# Patient Record
Sex: Male | Born: 1940 | Race: White | Hispanic: No | Marital: Married | Smoking: Former smoker
Health system: Southern US, Community
[De-identification: ages and names within clinical notes are randomized; demographics above are authoritative.]

## PROBLEM LIST (undated history)

## (undated) DIAGNOSIS — I442 Atrioventricular block, complete: Secondary | ICD-10-CM

## (undated) DIAGNOSIS — I447 Left bundle-branch block, unspecified: Secondary | ICD-10-CM

## (undated) DIAGNOSIS — R9431 Abnormal electrocardiogram [ECG] [EKG]: Secondary | ICD-10-CM

## (undated) DIAGNOSIS — I313 Pericardial effusion (noninflammatory): Secondary | ICD-10-CM

## (undated) DIAGNOSIS — K37 Unspecified appendicitis: Secondary | ICD-10-CM

## (undated) DIAGNOSIS — I420 Dilated cardiomyopathy: Secondary | ICD-10-CM

## (undated) DIAGNOSIS — I10 Essential (primary) hypertension: Secondary | ICD-10-CM

## (undated) DIAGNOSIS — R569 Unspecified convulsions: Secondary | ICD-10-CM

## (undated) DIAGNOSIS — I509 Heart failure, unspecified: Secondary | ICD-10-CM

## (undated) DIAGNOSIS — I3139 Other pericardial effusion (noninflammatory): Secondary | ICD-10-CM

## (undated) DIAGNOSIS — I480 Paroxysmal atrial fibrillation: Secondary | ICD-10-CM

## (undated) DIAGNOSIS — Z9889 Other specified postprocedural states: Secondary | ICD-10-CM

## (undated) DIAGNOSIS — J84112 Idiopathic pulmonary fibrosis: Secondary | ICD-10-CM

## (undated) HISTORY — PX: APPENDECTOMY: SHX54

## (undated) HISTORY — DX: Unspecified appendicitis: K37

## (undated) HISTORY — DX: Heart failure, unspecified: I50.9

## (undated) HISTORY — DX: Essential (primary) hypertension: I10

## (undated) HISTORY — DX: Abnormal electrocardiogram (ECG) (EKG): R94.31

---

## 2005-03-15 ENCOUNTER — Ambulatory Visit: Payer: Self-pay | Admitting: Gastroenterology

## 2005-04-08 ENCOUNTER — Ambulatory Visit: Payer: Self-pay | Admitting: Gastroenterology

## 2011-07-09 ENCOUNTER — Encounter: Payer: Self-pay | Admitting: Cardiovascular Disease

## 2011-07-16 ENCOUNTER — Encounter: Payer: Self-pay | Admitting: Cardiovascular Disease

## 2011-08-17 ENCOUNTER — Institutional Professional Consult (permissible substitution): Payer: Self-pay | Admitting: Cardiovascular Disease

## 2011-08-17 ENCOUNTER — Ambulatory Visit (INDEPENDENT_AMBULATORY_CARE_PROVIDER_SITE_OTHER): Payer: Medicare Other | Admitting: Cardiovascular Disease

## 2011-08-17 ENCOUNTER — Encounter: Payer: Self-pay | Admitting: Cardiovascular Disease

## 2011-08-17 DIAGNOSIS — I509 Heart failure, unspecified: Secondary | ICD-10-CM | POA: Diagnosis not present

## 2011-08-17 DIAGNOSIS — R9431 Abnormal electrocardiogram [ECG] [EKG]: Secondary | ICD-10-CM

## 2011-08-17 DIAGNOSIS — I10 Essential (primary) hypertension: Secondary | ICD-10-CM | POA: Diagnosis not present

## 2011-08-17 DIAGNOSIS — R0602 Shortness of breath: Secondary | ICD-10-CM

## 2011-08-17 DIAGNOSIS — I5022 Chronic systolic (congestive) heart failure: Secondary | ICD-10-CM | POA: Insufficient documentation

## 2011-08-17 LAB — BRAIN NATRIURETIC PEPTIDE: Pro B Natriuretic peptide (BNP): 20 pg/mL (ref 0.0–100.0)

## 2011-08-17 LAB — BASIC METABOLIC PANEL
Calcium: 9.1 mg/dL (ref 8.4–10.5)
Chloride: 102 mEq/L (ref 96–112)
Creatinine, Ser: 0.8 mg/dL (ref 0.4–1.5)
Sodium: 140 mEq/L (ref 135–145)

## 2011-08-17 MED ORDER — LISINOPRIL 20 MG PO TABS: 20.0000 mg | ORAL_TABLET | Freq: Every day | ORAL | Status: DC

## 2011-08-17 NOTE — Assessment & Plan Note (Signed)
Suspicious for DCM.  Echo

## 2011-08-17 NOTE — Assessment & Plan Note (Signed)
Check BNP and BMET  Start ACE given likelyhood of DCM with HTN

## 2011-08-17 NOTE — Patient Instructions (Addendum)
Your physician has requested that you have an echocardiogram. Echocardiography is a painless test that uses sound waves to create images of your heart. It provides your doctor with information about the size and shape of your heart and how well your heart's chambers and valves are working. This procedure takes approximately one hour. There are no restrictions for this procedure.  Your physician recommends that you schedule a follow-up appointment after your echocardiogram.  Your physician recommends that you have the following labs today:  BMET & BNP.  Your physician has recommended you make the following change in your medication: Start Lisinopril 20mg  daily.

## 2011-08-17 NOTE — Assessment & Plan Note (Signed)
Clinically ? chf Needs echo given abnormal ECG to clarify if diastolic, systolic or noncardiac

## 2011-08-17 NOTE — Progress Notes (Signed)
71 yo seen in [place] ER 12/14  NO records available.  ? CHF.  Rx with lasix and D/C.  Sees Dr Welton Flakes.  Patient denies history of CAD, MI, valvular disease or CHF. Indicates bronchitis before going to ER.  No fever or sputum.  Felt better after ER visit and still does.  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  Suspicous for DCM.  Patient denies PND, orthopnea or edema.  NO palpitations or syncope  ROS: Denies fever, malais, weight loss, blurry vision, decreased visual acuity, cough, sputum, SOB, hemoptysis, pleuritic pain, palpitaitons, heartburn, abdominal pain, melena, lower extremity edema, claudication, or rash.  All other systems reviewed and negative   General: Affect appropriate Healthy:  appears stated age HEENT: normal Neck supple with no adenopathy JVP normal no bruits no thyromegaly Lungs clear with no wheezing and good diaphragmatic motion Heart:  S1/S2 no murmur,rub, gallop or click PMI normal Abdomen: benighn, BS positve, no tenderness, no AAA no bruit.  No HSM or HJR Distal pulses intact with no bruits No edema Neuro non-focal Skin warm and dry No muscular weakness  Medications Current Outpatient Prescriptions  Medication Sig Dispense Refill  . Ascorbic Acid (VITAMIN C) 1000 MG tablet Take 1,000 mg by mouth daily.      Marland Kitchen aspirin 81 MG tablet Take 81 mg by mouth daily.      . Multiple Vitamin (MULTIVITAMIN) capsule Take 1 capsule by mouth daily.      Marland Kitchen omeprazole (PRILOSEC) 40 MG capsule Take 1 tablet by mouth as needed.         Allergies Review of patient's allergies indicates no known allergies.  Family History: Family History  Problem Relation Age of Onset  . Stroke Mother   . Kidney failure Sister     Social History: History   Social History  . Marital Status: Married    Spouse Name: N/A    Number of Children: N/A  . Years of Education: N/A   Occupational History  . Not on file.   Social History Main Topics  . Smoking  status: Former Games developer  . Smokeless tobacco: Not on file  . Alcohol Use: No  . Drug Use: No  . Sexually Active: Not on file   Other Topics Concern  . Not on file   Social History Narrative  . No narrative on file    Electrocardiogram:  NSR IVCD LVH with strain  Assessment and Plan

## 2011-08-18 ENCOUNTER — Telehealth: Payer: Self-pay | Admitting: Cardiovascular Disease

## 2011-08-18 NOTE — Telephone Encounter (Addendum)
ROI faxed to...  Dr.Jaber Welton Flakes @ [phone] [facility] @ [phone] 08/18/11/km  Records Received from [facility] gave to [facility] 08/18/11/KM  Records Received from Dr.Jaber Khan's Office gave to [employer] 08/19/11/KM

## 2011-08-20 ENCOUNTER — Ambulatory Visit (HOSPITAL_COMMUNITY): Payer: Medicare Other | Attending: Cardiology | Admitting: Radiology

## 2011-08-20 DIAGNOSIS — I447 Left bundle-branch block, unspecified: Secondary | ICD-10-CM | POA: Diagnosis not present

## 2011-08-20 DIAGNOSIS — R0609 Other forms of dyspnea: Secondary | ICD-10-CM | POA: Insufficient documentation

## 2011-08-20 DIAGNOSIS — R9431 Abnormal electrocardiogram [ECG] [EKG]: Secondary | ICD-10-CM | POA: Insufficient documentation

## 2011-08-20 DIAGNOSIS — I10 Essential (primary) hypertension: Secondary | ICD-10-CM | POA: Diagnosis not present

## 2011-08-20 DIAGNOSIS — Z87891 Personal history of nicotine dependence: Secondary | ICD-10-CM | POA: Diagnosis not present

## 2011-08-20 DIAGNOSIS — I509 Heart failure, unspecified: Secondary | ICD-10-CM

## 2011-08-20 DIAGNOSIS — R0989 Other specified symptoms and signs involving the circulatory and respiratory systems: Secondary | ICD-10-CM | POA: Insufficient documentation

## 2011-08-23 ENCOUNTER — Ambulatory Visit (INDEPENDENT_AMBULATORY_CARE_PROVIDER_SITE_OTHER): Payer: Medicare Other | Admitting: Cardiovascular Disease

## 2011-08-23 ENCOUNTER — Encounter: Payer: Self-pay | Admitting: Cardiovascular Disease

## 2011-08-23 ENCOUNTER — Ambulatory Visit: Payer: Medicare Other | Admitting: Cardiovascular Disease

## 2011-08-23 DIAGNOSIS — R9431 Abnormal electrocardiogram [ECG] [EKG]: Secondary | ICD-10-CM

## 2011-08-23 DIAGNOSIS — I10 Essential (primary) hypertension: Secondary | ICD-10-CM

## 2011-08-23 MED ORDER — LISINOPRIL 40 MG PO TABS: 40.0000 mg | ORAL_TABLET | Freq: Every day | ORAL | Status: DC

## 2011-08-23 NOTE — Assessment & Plan Note (Signed)
EF 45-50% Increase ACE.  BNP normal.  Lung exam still abnormal.  Quit smoking 64  Will try to get CXR reoport from [facility] but I suspect he will need a F/U

## 2011-08-23 NOTE — Progress Notes (Signed)
71 yo seen in [place] ER 12/14 NO records available. ? CHF. Rx with lasix and D/C. Sees Dr Welton Flakes. Patient denies history of CAD, MI, valvular disease or CHF. Indicates bronchitis before going to ER. No fever or sputum. Felt better after ER visit and still does. 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 Suspicous for DCM. Patient denies PND, orthopnea or edema. NO palpitations or syncope  Echo 08/19/09  - Left ventricle: The cavity size was normal. Systolic function was mildly reduced. The estimated ejection fraction was in the range of 45% to 50%. Hypokinesis of the septal myocardium due to bundle branch block. Doppler parameters are consistent with abnormal left ventricular relaxation (grade 1 diastolic dysfunction). - Pulmonary arteries: PA peak pressure: 34mm Hg (S).  BNP 1/22 normal.     ROS: Denies fever, malais, weight loss, blurry vision, decreased visual acuity, cough, sputum, SOB, hemoptysis, pleuritic pain, palpitaitons, heartburn, abdominal pain, melena, lower extremity edema, claudication, or rash.  All other systems reviewed and negative  General: Affect appropriate Healthy:  appears stated age HEENT: normal Neck supple with no adenopathy JVP normal no bruits no thyromegaly Lungs clear with no wheezing and good diaphragmatic motion Heart:  S1/S2 no murmur, no rub, gallop or click PMI normal Abdomen: benighn, BS positve, no tenderness, no AAA no bruit.  No HSM or HJR Distal pulses intact with no bruits No edema Neuro non-focal Skin warm and dry No muscular weakness   Current Outpatient Prescriptions  Medication Sig Dispense Refill  . Ascorbic Acid (VITAMIN C) 1000 MG tablet Take 1,000 mg by mouth daily.      Marland Kitchen aspirin 81 MG tablet Take 81 mg by mouth daily.      Marland Kitchen lisinopril (PRINIVIL,ZESTRIL) 20 MG tablet Take 1 tablet (20 mg total) by mouth daily.  30 tablet  11  . Multiple Vitamin (MULTIVITAMIN) capsule Take 1 capsule by mouth daily.      Marland Kitchen  omeprazole (PRILOSEC) 40 MG capsule Take 1 tablet by mouth as needed.         Allergies  Review of patient's allergies indicates no known allergies.  Electrocardiogram:  Assessment and Plan

## 2011-08-23 NOTE — Assessment & Plan Note (Signed)
Increase ACE  Low sodium diet

## 2011-08-23 NOTE — Patient Instructions (Signed)
Your physician wants you to follow-up  3 MONTHS WITH DR Haywood Filler will receive a reminder letter in the mail two months in advance. If you don't receive a letter, please call our office to schedule the follow-up appointment.  Your physician has recommended you make the following change in your medication: INCREASE LISINOPRIL 40 MG EVERY DAY

## 2011-08-23 NOTE — Assessment & Plan Note (Signed)
Likely related to DCM  LBBB no syncope  Yearly ECG

## 2011-09-01 ENCOUNTER — Ambulatory Visit: Payer: Medicare Other | Admitting: Cardiovascular Disease

## 2011-10-26 ENCOUNTER — Telehealth: Payer: Self-pay | Admitting: Cardiovascular Disease

## 2011-10-26 MED ORDER — LOSARTAN POTASSIUM 100 MG PO TABS: 100.0000 mg | ORAL_TABLET | Freq: Every day | ORAL | Status: DC

## 2011-10-26 NOTE — Telephone Encounter (Signed)
SPOKE WITH  PT C/O COUGH NOTED  COUPLE DAYS AFTER STARTING LISINOPRIL 40 MG HAS BEEN ON  FOR  COUPLE MONTHS  DISCUSSED WITH LORI GERHARDT NP  STOP LISINOPRIL AND START LOSARTAN 100 MG EVERY DAY.PT AWARE./CY

## 2011-10-26 NOTE — Telephone Encounter (Signed)
New Msg: Pt calling wanting to speak with nurse/MD regarding pt lisinopril. Pt c/o dry cough associated with RX and wanted to know if MD could substitute RX with another medication. Please return pt call to discuss further.

## 2011-11-24 ENCOUNTER — Encounter: Payer: Self-pay | Admitting: *Deleted

## 2011-11-25 ENCOUNTER — Ambulatory Visit (INDEPENDENT_AMBULATORY_CARE_PROVIDER_SITE_OTHER): Payer: Medicare Other | Admitting: Cardiovascular Disease

## 2011-11-25 ENCOUNTER — Encounter: Payer: Self-pay | Admitting: Cardiovascular Disease

## 2011-11-25 VITALS — BP 131/80 | HR 88 | Ht 70.0 in | Wt 146.0 lb

## 2011-11-25 DIAGNOSIS — I10 Essential (primary) hypertension: Secondary | ICD-10-CM

## 2011-11-25 DIAGNOSIS — I509 Heart failure, unspecified: Secondary | ICD-10-CM | POA: Diagnosis not present

## 2011-11-25 DIAGNOSIS — R9431 Abnormal electrocardiogram [ECG] [EKG]: Secondary | ICD-10-CM

## 2011-11-25 MED ORDER — LOSARTAN POTASSIUM 100 MG PO TABS: 100.0000 mg | ORAL_TABLET | Freq: Every day | ORAL | Status: DC

## 2011-11-25 NOTE — Assessment & Plan Note (Signed)
Improved continue ARB 

## 2011-11-25 NOTE — Patient Instructions (Signed)
Your physician wants you to follow-up in:  6 MONTHS WITH DR NISHAN  You will receive a reminder letter in the mail two months in advance. If you don't receive a letter, please call our office to schedule the follow-up appointment. Your physician recommends that you continue on your current medications as directed. Please refer to the Current Medication list given to you today. 

## 2011-11-25 NOTE — Progress Notes (Signed)
71 yo seen in [place] ER 12/14 NO records available. ? CHF. Rx with lasix and D/C. Sees Dr Welton Flakes. Patient denies history of CAD, MI, valvular disease or CHF. Indicates bronchitis before going to ER. No fever or sputum. Felt better after ER visit and still does. 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 Suspicous for DCM. Patient denies PND, orthopnea or edema. NO palpitations or syncope  Echo 08/19/09  - Left ventricle: The cavity size was normal. Systolic function was mildly reduced. The estimated ejection fraction was in the range of 45% to 50%. Hypokinesis of the septal myocardium due to bundle branch block. Doppler parameters are consistent with abnormal left ventricular relaxation (grade 1 diastolic dysfunction). - Pulmonary arteries: PA peak pressure: 34mm Hg (S).  BNP 1/22 normal.  Started on Lisinopril 1/13  Had cough and changed to ARB  Improved  ROS: Denies fever, malais, weight loss, blurry vision, decreased visual acuity, cough, sputum, SOB, hemoptysis, pleuritic pain, palpitaitons, heartburn, abdominal pain, melena, lower extremity edema, claudication, or rash.  All other systems reviewed and negative  General: Affect appropriate Healthy:  appears stated age HEENT: normal Neck supple with no adenopathy JVP normal no bruits no thyromegaly Lungs clear with no wheezing and good diaphragmatic motion Heart:  S1/S2 no murmur, no rub, gallop or click PMI normal Abdomen: benighn, BS positve, no tenderness, no AAA no bruit.  No HSM or HJR Distal pulses intact with no bruits No edema Neuro non-focal Skin warm and dry No muscular weakness   Current Outpatient Prescriptions  Medication Sig Dispense Refill  . Ascorbic Acid (VITAMIN C) 1000 MG tablet Take 1,000 mg by mouth daily.      Marland Kitchen aspirin 81 MG tablet Take 81 mg by mouth daily.      Marland Kitchen losartan (COZAAR) 100 MG tablet Take 1 tablet (100 mg total) by mouth daily.  30 tablet  6  . Multiple Vitamin  (MULTIVITAMIN) capsule Take 1 capsule by mouth daily.      Marland Kitchen omeprazole (PRILOSEC) 40 MG capsule Take 1 tablet by mouth as needed.       . clobetasol ointment (TEMOVATE) 0.05 % prn        Allergies  Review of patient's allergies indicates no known allergies.  Electrocardiogram:  Assessment and Plan

## 2011-11-25 NOTE — Assessment & Plan Note (Signed)
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

## 2011-11-25 NOTE — Assessment & Plan Note (Signed)
Resolved.  NIDCM  Continue ARB  Euvolemic  F/U EF in a year ? MRI or echo

## 2012-05-29 ENCOUNTER — Encounter: Payer: Self-pay | Admitting: Cardiovascular Disease

## 2012-05-29 DIAGNOSIS — Z23 Encounter for immunization: Secondary | ICD-10-CM | POA: Diagnosis not present

## 2012-05-29 DIAGNOSIS — Z79899 Other long term (current) drug therapy: Secondary | ICD-10-CM | POA: Diagnosis not present

## 2012-05-29 DIAGNOSIS — Z125 Encounter for screening for malignant neoplasm of prostate: Secondary | ICD-10-CM | POA: Diagnosis not present

## 2012-06-05 ENCOUNTER — Ambulatory Visit (INDEPENDENT_AMBULATORY_CARE_PROVIDER_SITE_OTHER): Payer: Medicare Other | Admitting: Cardiovascular Disease

## 2012-06-05 ENCOUNTER — Encounter: Payer: Self-pay | Admitting: Cardiovascular Disease

## 2012-06-05 VITALS — BP 148/80 | HR 78 | Wt 156.0 lb

## 2012-06-05 DIAGNOSIS — I509 Heart failure, unspecified: Secondary | ICD-10-CM | POA: Diagnosis not present

## 2012-06-05 DIAGNOSIS — E782 Mixed hyperlipidemia: Secondary | ICD-10-CM

## 2012-06-05 NOTE — Assessment & Plan Note (Signed)
Discussed low fat diet Accelerate trial is no longer enrolling for low HDL

## 2012-06-05 NOTE — Assessment & Plan Note (Signed)
Fairly well controlled Would like to add low dose coreg Will see if we can get him use to the idea over the next few months

## 2012-06-05 NOTE — Assessment & Plan Note (Signed)
Stage on euvolemic he does not want to be on multiple meds. Continue ARB  F/U echo in may

## 2012-06-05 NOTE — Assessment & Plan Note (Signed)
IVCD no high grade AV block No syncope or palpitations ECG with next visit

## 2012-06-05 NOTE — Patient Instructions (Signed)
Your physician wants you to follow-up in:   6 MONTHS WITH DR Eden Emms AND  HAVE ECHO SAME DAY  You will receive a reminder letter in the mail two months in advance. If you don't receive a letter, please call our office to schedule the follow-up appointment. Your physician recommends that you continue on your current medications as directed. Please refer to the Current Medication list given to you today.  Your physician has requested that you have an echocardiogram. Echocardiography is a painless test that uses sound waves to create images of your heart. It provides your doctor with information about the size and shape of your heart and how well your heart's chambers and valves are working. This procedure takes approximately one hour. There are no restrictions for this procedure. DX  IN 6 MONTHS

## 2012-06-05 NOTE — Progress Notes (Signed)
Patient ID: Dale Winters, male   DOB: 1941-02-25, 71 y.o.   MRN: [id number] 71 yo seen in [place] ER 12/14 NO records available. ? CHF. Rx with lasix and D/C. Sees Dr Welton Flakes. Patient denies history of CAD, MI, valvular disease or CHF. Indicates bronchitis before going to ER. No fever or sputum. Felt better after ER visit and still does. 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 Suspicous for DCM. Patient denies PND, orthopnea or edema. NO palpitations or syncope   Echo 08/19/09  - Left ventricle: The cavity size was normal. Systolic function was mildly reduced. The estimated ejection fraction was in the range of 45% to 50%. Hypokinesis of the septal myocardium due to bundle branch block. Doppler parameters are consistent with abnormal left ventricular relaxation (grade 1 diastolic dysfunction). - Pulmonary arteries: PA peak pressure: 34mm Hg (S).  BNP 1/22 normal.  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  All other systems reviewed and negative  General: Affect appropriate Healthy:  appears stated age HEENT: normal Neck supple with no adenopathy JVP normal no bruits no thyromegaly Lungs clear with no wheezing and good diaphragmatic motion Heart:  S1/S2 no murmur, no rub, gallop or click PMI normal Abdomen: benighn, BS positve, no tenderness, no AAA no bruit.  No HSM or HJR Distal pulses intact with no bruits No edema Neuro non-focal Skin warm and dry No muscular weakness   Current Outpatient Prescriptions  Medication Sig Dispense Refill  . Ascorbic Acid (VITAMIN C)  1000 MG tablet Take 1,000 mg by mouth daily.      Marland Kitchen aspirin 81 MG tablet Take 81 mg by mouth daily.      . clobetasol ointment (TEMOVATE) 0.05 % prn      . losartan (COZAAR) 100 MG tablet Take 1 tablet (100 mg total) by mouth daily.  30 tablet  11  . Multiple Vitamin (MULTIVITAMIN) capsule Take 1 capsule by mouth daily.        Allergies  Review of patient's allergies indicates no known allergies.  Electrocardiogram:  1/13  SR rate 84 IVCD lateral T wave changes  Assessment and Plan

## 2012-06-14 DIAGNOSIS — D7589 Other specified diseases of blood and blood-forming organs: Secondary | ICD-10-CM | POA: Diagnosis not present

## 2012-06-14 DIAGNOSIS — J309 Allergic rhinitis, unspecified: Secondary | ICD-10-CM | POA: Diagnosis not present

## 2012-06-14 DIAGNOSIS — K219 Gastro-esophageal reflux disease without esophagitis: Secondary | ICD-10-CM | POA: Diagnosis not present

## 2012-11-06 ENCOUNTER — Other Ambulatory Visit: Payer: Self-pay | Admitting: *Deleted

## 2012-11-06 MED ORDER — LOSARTAN POTASSIUM 100 MG PO TABS: 100.0000 mg | ORAL_TABLET | Freq: Every day | ORAL | Status: DC

## 2012-12-05 ENCOUNTER — Encounter: Payer: Self-pay | Admitting: Cardiovascular Disease

## 2012-12-05 ENCOUNTER — Ambulatory Visit (INDEPENDENT_AMBULATORY_CARE_PROVIDER_SITE_OTHER): Payer: Medicare Other | Admitting: Cardiovascular Disease

## 2012-12-05 VITALS — BP 147/82 | HR 86 | Wt 159.0 lb

## 2012-12-05 DIAGNOSIS — R9431 Abnormal electrocardiogram [ECG] [EKG]: Secondary | ICD-10-CM

## 2012-12-05 DIAGNOSIS — I10 Essential (primary) hypertension: Secondary | ICD-10-CM

## 2012-12-05 DIAGNOSIS — E782 Mixed hyperlipidemia: Secondary | ICD-10-CM | POA: Diagnosis not present

## 2012-12-05 DIAGNOSIS — I509 Heart failure, unspecified: Secondary | ICD-10-CM | POA: Diagnosis not present

## 2012-12-05 NOTE — Assessment & Plan Note (Signed)
Stable LBBB with no syncope or high grade AV block

## 2012-12-05 NOTE — Assessment & Plan Note (Signed)
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

## 2012-12-05 NOTE — Assessment & Plan Note (Signed)
Well controlled.  Continue current medications and low sodium Dash type diet.    

## 2012-12-05 NOTE — Progress Notes (Signed)
Patient ID: Dale Winters, male   DOB: 1941/02/19, 72 y.o.   MRN: [id number] 72 yo seen in [place] ER 12/14 NO records available. ? CHF. Rx with lasix and D/C. Sees Dr Welton Flakes. Patient denies history of CAD, MI, valvular disease or CHF. Indicates bronchitis before going to ER. No fever or sputum. Felt better after ER visit and still does. 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 Suspicous for DCM. Patient denies PND, orthopnea or edema. NO palpitations or syncope   Echo 08/19/09  - Left ventricle: The cavity size was normal. Systolic function was mildly reduced. The estimated ejection fraction was in the range of 45% to 50%. Hypokinesis of the septal myocardium due to bundle branch block. Doppler parameters are consistent with abnormal left ventricular relaxation (grade 1 diastolic dysfunction). - Pulmonary arteries: PA peak pressure: 34mm Hg (S).  BNP 1/22 normal.  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  All other systems reviewed and negative  General: Affect appropriate Healthy:  appears stated age HEENT: normal Neck supple with no adenopathy JVP normal no bruits no thyromegaly Lungs clear with no wheezing and good diaphragmatic motion Heart:  S1/S2 no murmur, no rub, gallop or click PMI normal Abdomen: benighn, BS positve, no tenderness, no AAA no bruit.  No HSM or HJR Distal pulses intact with no bruits No edema Neuro non-focal Skin warm and dry No muscular weakness   Current Outpatient Prescriptions  Medication  Sig Dispense Refill  . Ascorbic Acid (VITAMIN C) 1000 MG tablet Take 1,000 mg by mouth daily.      Marland Kitchen aspirin 81 MG tablet Take 81 mg by mouth daily.      . clobetasol ointment (TEMOVATE) 0.05 % prn      . COZAAR 100 MG tablet Take 1 tablet by mouth daily.      Marland Kitchen losartan (COZAAR) 100 MG tablet Take 1 tablet (100 mg total) by mouth daily.  30 tablet  11  . Multiple Vitamin (MULTIVITAMIN) capsule Take 1 capsule by mouth daily.      Marland Kitchen omeprazole (PRILOSEC) 40 MG capsule Take 1 capsule by mouth as needed.       No current facility-administered medications for this visit.    Allergies  Review of patient's allergies indicates no known allergies.  Electrocardiogram: SR rate 86 LAD LBBB   Assessment and Plan

## 2012-12-05 NOTE — Patient Instructions (Signed)
Your physician wants you to follow-up in: November  WITH DR Eden Emms AND ECHO SAME DAY  You will receive a reminder letter in the mail two months in advance. If you don't receive a letter, please call our office to schedule the follow-up appointment. Your physician recommends that you continue on your current medications as directed. Please refer to the Current Medication list given to you today. Your physician has requested that you have an echocardiogram. Echocardiography is a painless test that uses sound waves to create images of your heart. It provides your doctor with information about the size and shape of your heart and how well your heart's chambers and valves are working. This procedure takes approximately one hour. There are no restrictions for this procedure. DUE IN NOVEMBER SEE DR Eden Emms SAME DAY

## 2012-12-05 NOTE — Assessment & Plan Note (Signed)
Cholesterol is at goal.  Continue current dose of statin and diet Rx.  No myalgias or side effects.  F/U  LFT's in 6 months. No results found for this basename: [facility]  Labs with Dr Welton Flakes

## 2012-12-06 ENCOUNTER — Other Ambulatory Visit (HOSPITAL_COMMUNITY): Payer: Medicare Other

## 2013-02-28 ENCOUNTER — Encounter: Payer: Self-pay | Admitting: Gastroenterology

## 2013-04-19 DIAGNOSIS — Z23 Encounter for immunization: Secondary | ICD-10-CM | POA: Diagnosis not present

## 2013-06-04 ENCOUNTER — Ambulatory Visit (INDEPENDENT_AMBULATORY_CARE_PROVIDER_SITE_OTHER): Payer: Medicare Other | Admitting: Cardiovascular Disease

## 2013-06-04 ENCOUNTER — Ambulatory Visit (HOSPITAL_COMMUNITY): Payer: Medicare Other | Attending: Cardiovascular Disease | Admitting: Radiology

## 2013-06-04 ENCOUNTER — Encounter (INDEPENDENT_AMBULATORY_CARE_PROVIDER_SITE_OTHER): Payer: Self-pay

## 2013-06-04 ENCOUNTER — Encounter: Payer: Self-pay | Admitting: Cardiovascular Disease

## 2013-06-04 VITALS — BP 145/81 | HR 85 | Ht 70.0 in | Wt 157.4 lb

## 2013-06-04 DIAGNOSIS — I10 Essential (primary) hypertension: Secondary | ICD-10-CM | POA: Insufficient documentation

## 2013-06-04 DIAGNOSIS — R9431 Abnormal electrocardiogram [ECG] [EKG]: Secondary | ICD-10-CM | POA: Diagnosis not present

## 2013-06-04 DIAGNOSIS — I509 Heart failure, unspecified: Secondary | ICD-10-CM | POA: Diagnosis not present

## 2013-06-04 DIAGNOSIS — E782 Mixed hyperlipidemia: Secondary | ICD-10-CM

## 2013-06-04 DIAGNOSIS — R0602 Shortness of breath: Secondary | ICD-10-CM

## 2013-06-04 DIAGNOSIS — I079 Rheumatic tricuspid valve disease, unspecified: Secondary | ICD-10-CM | POA: Diagnosis not present

## 2013-06-04 MED ORDER — LOSARTAN POTASSIUM 100 MG PO TABS: 100.0000 mg | ORAL_TABLET | Freq: Every day | ORAL | Status: DC

## 2013-06-04 NOTE — Assessment & Plan Note (Signed)
Euvolemic with good functional status Continue current meds

## 2013-06-04 NOTE — Assessment & Plan Note (Signed)
Chronic LBBB stable Reflective of DCM No high grade heart block Continue yearly ECG

## 2013-06-04 NOTE — Progress Notes (Signed)
Echocardiogram performed.  

## 2013-06-04 NOTE — Assessment & Plan Note (Addendum)
Well controlled.  Continue current medications and low sodium Dash type diet.   Runs lower at home Did not take cozaar yet this am

## 2013-06-04 NOTE — Patient Instructions (Signed)
Your physician wants you to follow-up in: YEAR WITH DR NISHAN  You will receive a reminder letter in the mail two months in advance. If you don't receive a letter, please call our office to schedule the follow-up appointment.  Your physician recommends that you continue on your current medications as directed. Please refer to the Current Medication list given to you today. 

## 2013-06-04 NOTE — Progress Notes (Signed)
Patient ID: Dale Winters, male   DOB: May 05, 1941, 72 y.o.   MRN: [id number] 72 yo seen in [place] ER 12/14 NO records available. ? CHF. Rx with lasix and D/C. Sees Dr Welton Flakes. Patient denies history of CAD, MI, valvular disease or CHF. Indicates bronchitis before going to ER. No fever or sputum. Felt better after ER visit and still does. 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 Suspicous for DCM. Patient denies PND, orthopnea or edema. NO palpitations or syncope  Echo 08/20/11 stable from 2011 Study Conclusions  - Left ventricle: The cavity size was normal. Systolic function was mildly reduced. The estimated ejection fraction was in the range of 45% to 50%. Hypokinesis of the septal myocardium due to bundle branch block. Doppler parameters are consistent with abnormal left ventricular relaxation (grade 1 diastolic dysfunction). - Pulmonary arteries: PA peak pressure: 34mm Hg (S).  Reviewed echo from today and stable as well EF 40-45% trivial MR    BNP 1/22 normal.  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  All other systems reviewed and negative  General: Affect appropriate Healthy:  appears stated age HEENT: normal Neck supple with no adenopathy JVP normal no bruits no thyromegaly Lungs clear with no wheezing and good diaphragmatic motion Heart:  S1/S2 split no murmur, no rub, gallop or click PMI normal Abdomen: benighn, BS positve, no tenderness, no AAA no bruit.  No HSM or  HJR Distal pulses intact with no bruits No edema Neuro non-focal Skin warm and dry No muscular weakness   Current Outpatient Prescriptions  Medication Sig Dispense Refill  . Ascorbic Acid (VITAMIN C) 1000 MG tablet Take 1,000 mg by mouth daily.      Marland Kitchen aspirin 81 MG tablet Take 81 mg by mouth daily.      . clobetasol ointment (TEMOVATE) 0.05 % prn      . COZAAR 100 MG tablet Take 1 tablet by mouth daily.      Marland Kitchen losartan (COZAAR) 100 MG tablet Take 1 tablet (100 mg total) by mouth daily.  30 tablet  11  . Multiple Vitamin (MULTIVITAMIN) capsule Take 1 capsule by mouth daily.      Marland Kitchen omeprazole (PRILOSEC) 40 MG capsule Take 1 capsule by mouth as needed.       No current facility-administered medications for this visit.    Allergies  Ace inhibitors  Electrocardiogram: 12/05/12 SR rate 86 LAD LBBB   Assessment and Plan

## 2013-06-04 NOTE — Assessment & Plan Note (Signed)
Cholesterol is at goal.  Continue current dose of statin and diet Rx.  No myalgias or side effects.  F/U  LFT's in 6 months. No results found for this basename: [facility]  Labs with primary in [place] Using low fat diet

## 2013-06-18 DIAGNOSIS — E782 Mixed hyperlipidemia: Secondary | ICD-10-CM | POA: Diagnosis not present

## 2013-06-18 DIAGNOSIS — Z125 Encounter for screening for malignant neoplasm of prostate: Secondary | ICD-10-CM | POA: Diagnosis not present

## 2013-06-18 DIAGNOSIS — Z79899 Other long term (current) drug therapy: Secondary | ICD-10-CM | POA: Diagnosis not present

## 2013-06-27 DIAGNOSIS — Z0000 Encounter for general adult medical examination without abnormal findings: Secondary | ICD-10-CM | POA: Diagnosis not present

## 2013-06-27 DIAGNOSIS — J309 Allergic rhinitis, unspecified: Secondary | ICD-10-CM | POA: Diagnosis not present

## 2013-06-27 DIAGNOSIS — K219 Gastro-esophageal reflux disease without esophagitis: Secondary | ICD-10-CM | POA: Diagnosis not present

## 2013-06-28 DIAGNOSIS — J309 Allergic rhinitis, unspecified: Secondary | ICD-10-CM | POA: Diagnosis not present

## 2013-06-28 DIAGNOSIS — Z006 Encounter for examination for normal comparison and control in clinical research program: Secondary | ICD-10-CM | POA: Diagnosis not present

## 2013-06-28 DIAGNOSIS — K219 Gastro-esophageal reflux disease without esophagitis: Secondary | ICD-10-CM | POA: Diagnosis not present

## 2013-06-28 DIAGNOSIS — L259 Unspecified contact dermatitis, unspecified cause: Secondary | ICD-10-CM | POA: Diagnosis not present

## 2013-11-02 ENCOUNTER — Other Ambulatory Visit: Payer: Self-pay | Admitting: Cardiovascular Disease

## 2013-12-01 ENCOUNTER — Other Ambulatory Visit: Payer: Self-pay | Admitting: Cardiovascular Disease

## 2013-12-07 DIAGNOSIS — K219 Gastro-esophageal reflux disease without esophagitis: Secondary | ICD-10-CM | POA: Diagnosis not present

## 2013-12-07 DIAGNOSIS — I10 Essential (primary) hypertension: Secondary | ICD-10-CM | POA: Diagnosis not present

## 2013-12-07 DIAGNOSIS — J309 Allergic rhinitis, unspecified: Secondary | ICD-10-CM | POA: Diagnosis not present

## 2013-12-19 ENCOUNTER — Telehealth: Payer: Self-pay | Admitting: Cardiovascular Disease

## 2013-12-19 NOTE — Telephone Encounter (Signed)
PT   IS  COMPLAINING  WITH   NO ENERGY   AND  HAS   NOTED  THIS FOR   APPROX    1 MO   BUT NO  PAIN  PT  THOUGHT   ONCE   WAS  SUGGESTED MAYBE  NEEDED  PACEMAKER    WOULD LIKE  AN EARLIER  APPT  WITH   DR  Eden Emms   APPT MADE  WITH  SCOTT  WEAVER  [facility] FOR   01-10-14  AT   9:50  AM WILL FORWARD  TO  DR Eden Emms FOR  REVIEW  .Zack Seal

## 2013-12-19 NOTE — Telephone Encounter (Signed)
New message          Pt has no energy and does not feel well / pt would like to be seen before November / there are no open slots available between now and July / can you work him in?

## 2013-12-19 NOTE — Telephone Encounter (Signed)
Only has LBBB doubt he needs pacer ok to see scott

## 2013-12-19 NOTE — Telephone Encounter (Signed)
PT NOTIFIED.   INSTRUCTED   IF  S/S  WORSEN  MAY  F/U WITH PMD  , AS  WELL, VERBALIZED  UNDERSTANDING./CY

## 2014-01-10 ENCOUNTER — Ambulatory Visit (INDEPENDENT_AMBULATORY_CARE_PROVIDER_SITE_OTHER): Payer: Medicare Other | Admitting: Physician Assistant

## 2014-01-10 ENCOUNTER — Encounter: Payer: Self-pay | Admitting: Physician Assistant

## 2014-01-10 VITALS — BP 137/70 | HR 75 | Ht 70.0 in | Wt 147.0 lb

## 2014-01-10 DIAGNOSIS — I5022 Chronic systolic (congestive) heart failure: Secondary | ICD-10-CM

## 2014-01-10 DIAGNOSIS — I428 Other cardiomyopathies: Secondary | ICD-10-CM

## 2014-01-10 DIAGNOSIS — I10 Essential (primary) hypertension: Secondary | ICD-10-CM

## 2014-01-10 DIAGNOSIS — I429 Cardiomyopathy, unspecified: Secondary | ICD-10-CM | POA: Insufficient documentation

## 2014-01-10 NOTE — Progress Notes (Signed)
Cardiology Office Note   Date:  01/10/2014   ID:  Dale Winters, DOB 18-Aug-1940, MRN [id number]  PCP:  Lise Auer, MD  Cardiologist:  Dr. Charlton Haws       History of Present Illness: Dale Winters is a 73 y.o. male with a history of cardiomyopathy, LBBB, HTN, HL.  Last seen by Dr. Charlton Haws 05/2013.  He recently had prolonged symptoms of nasal and head congestion. He received a steroid injection from his primary care physician and was asked to follow up here for dyspnea. Since getting a steroid injection, his nasal congestion has resolved. He feels back to baseline. He denies significant dyspnea. He is NYHA class II. He denies chest pain, syncope, near syncope, orthopnea, PND or edema.   Studies:  - Echo (06/04/13):  Septal and apical HK, EF 40-45%, PASP 36 mm Hg  - Echo (07/2011): EF 45-50%    Recent Labs: No results found for requested labs within last 365 days.  Wt Readings from Last 3 Encounters:  01/10/14 147 lb (66.679 kg)  06/04/13 157 lb 6.4 oz (71.396 kg)  12/05/12 159 lb (72.122 kg)     Past Medical History  Diagnosis Date  . Appendicitis     1956  . CHF (congestive heart failure)   . Abnormal ECG   . HTN (hypertension)     Current Outpatient Prescriptions  Medication Sig Dispense Refill  . aspirin 81 MG tablet Take 81 mg by mouth daily.      . clobetasol ointment (TEMOVATE) 0.05 % prn      . losartan (COZAAR) 100 MG tablet TAKE 1 TABLET BY MOUTH EVERY DAY  90 tablet  0  . Multiple Vitamin (MULTIVITAMIN) capsule Take 1 capsule by mouth daily.      Marland Kitchen omeprazole (PRILOSEC) 40 MG capsule Take 1 capsule by mouth as needed.       No current facility-administered medications for this visit.    Allergies:   Ace inhibitors   Social History:  The patient  reports that he has quit smoking. He does not have any smokeless tobacco history on file. He reports that he does not drink alcohol or use illicit drugs.   Family History:  The patient's family history  includes Kidney failure in his sister; Stroke in his mother.   ROS:  Please see the history of present illness.      All other systems reviewed and negative.   PHYSICAL EXAM: VS:  BP 137/70  Pulse 75  Ht 5\' 10"  (1.778 m)  Wt 147 lb (66.679 kg)  BMI 21.09 kg/m2 Well nourished, well developed, in no acute distress HEENT: normal Neck: no JVD Cardiac:  normal S1, S2; RRR; no murmur Lungs:  clear to auscultation bilaterally, no wheezing, rhonchi or rales Abd: soft, nontender, no hepatomegaly Ext: no edema Skin: warm and dry Neuro:  CNs 2-12 intact, no focal abnormalities noted  EKG:  NSR, HR 75, LBBB     ASSESSMENT AND PLAN:  1. Cardiomyopathy:  Etiology not clear (? 2/2 LBBB).  Recent echo with stable EF.  He remains on ARB.  He is not on beta blocker (? 2/2 evidence of conduction system disease).  I will review this with Dr. Charlton Haws.   2. Chronic systolic heart failure:  No evidence of volume overload. Recent episode of dyspnea related to sinus congestion.  He is at his baseline.  He is not on diuretic therapy. Continue current regimen. 3. HTN (hypertension):  Controlled. 4. Disposition:  Followup with Dr. Eden EmmsNishan in 05/2014 as planned.   Signed, Brynda RimScott Weaver, PA-C, MHS 01/10/2014 9:54 AM    [facility] [address], [place], [place]  [address] Phone: [phone]; Fax: [phone]

## 2014-01-10 NOTE — Patient Instructions (Signed)
NO CHANGES WERE MADE TODAY  FOLLOW UP WITH DR. Eden Emms IN 05/2014; WE WILL SEND OUT A REMINDER LETTER 2 MONTHS BEFORE TO ASK TO MAKE AN APPT.

## 2014-03-03 ENCOUNTER — Other Ambulatory Visit: Payer: Self-pay | Admitting: Cardiovascular Disease

## 2014-03-28 ENCOUNTER — Telehealth: Payer: Self-pay | Admitting: Cardiovascular Disease

## 2014-03-28 DIAGNOSIS — I255 Ischemic cardiomyopathy: Secondary | ICD-10-CM

## 2014-03-28 NOTE — Telephone Encounter (Signed)
Follow up:    Per pt he does and Echo every yr does he need one the same day 11/13 of his dr appt.    Need an order and appt set  Please let scheduler know.

## 2014-03-28 NOTE — Telephone Encounter (Signed)
Yes for cardiomyopathy

## 2014-03-28 NOTE — Telephone Encounter (Signed)
DOES PT  NEED REPEAT  ECHO  THIS  November   PRIO TO  OFFICE VISIT?

## 2014-05-22 DIAGNOSIS — Z23 Encounter for immunization: Secondary | ICD-10-CM | POA: Diagnosis not present

## 2014-06-07 ENCOUNTER — Encounter: Payer: Self-pay | Admitting: Cardiovascular Disease

## 2014-06-07 ENCOUNTER — Ambulatory Visit (HOSPITAL_COMMUNITY): Payer: Medicare Other | Attending: Cardiovascular Disease | Admitting: Cardiology

## 2014-06-07 ENCOUNTER — Ambulatory Visit (INDEPENDENT_AMBULATORY_CARE_PROVIDER_SITE_OTHER): Payer: Medicare Other | Admitting: Cardiovascular Disease

## 2014-06-07 VITALS — BP 122/62 | HR 80 | Ht 70.0 in | Wt 151.1 lb

## 2014-06-07 DIAGNOSIS — I429 Cardiomyopathy, unspecified: Secondary | ICD-10-CM | POA: Insufficient documentation

## 2014-06-07 DIAGNOSIS — I5022 Chronic systolic (congestive) heart failure: Secondary | ICD-10-CM | POA: Diagnosis not present

## 2014-06-07 DIAGNOSIS — R0602 Shortness of breath: Secondary | ICD-10-CM | POA: Diagnosis not present

## 2014-06-07 DIAGNOSIS — I10 Essential (primary) hypertension: Secondary | ICD-10-CM | POA: Diagnosis not present

## 2014-06-07 DIAGNOSIS — R0600 Dyspnea, unspecified: Secondary | ICD-10-CM | POA: Insufficient documentation

## 2014-06-07 DIAGNOSIS — Z79899 Other long term (current) drug therapy: Secondary | ICD-10-CM

## 2014-06-07 DIAGNOSIS — R062 Wheezing: Secondary | ICD-10-CM

## 2014-06-07 DIAGNOSIS — I255 Ischemic cardiomyopathy: Secondary | ICD-10-CM | POA: Diagnosis not present

## 2014-06-07 LAB — BASIC METABOLIC PANEL
BUN: 12 mg/dL (ref 6–23)
CHLORIDE: 102 meq/L (ref 96–112)
CO2: 27 mEq/L (ref 19–32)
Calcium: 9.6 mg/dL (ref 8.4–10.5)
Creatinine, Ser: 0.9 mg/dL (ref 0.4–1.5)
GFR: 85.41 mL/min (ref >60.00)
Glucose, Bld: 101 mg/dL — ABNORMAL HIGH (ref 70–99)
POTASSIUM: 4.1 meq/L (ref 3.5–5.1)
SODIUM: 138 meq/L (ref 135–145)

## 2014-06-07 LAB — BRAIN NATRIURETIC PEPTIDE: PRO B NATRI PEPTIDE: 17 pg/mL (ref 0.0–100.0)

## 2014-06-07 NOTE — Patient Instructions (Signed)
Your physician wants you to follow-up in:   6 MONTHS WITH  DR Haywood Filler will receive a reminder letter in the mail two months in advance. If you don't receive a letter, please call our office to schedule the follow-up appointment. Your physician recommends that you continue on your current medications as directed. Please refer to the Current Medication list given to you today.  Your physician recommends that you return for lab work in:  TODAY  BMET BNP  Your physician has recommended that you have a pulmonary function test. Pulmonary Function Tests are a group of tests that measure how well air moves in and out of your lungs.  CHEST  CT  WITHOUT  CONTRAST

## 2014-06-07 NOTE — Assessment & Plan Note (Signed)
Well controlled.  Continue current medications and low sodium Dash type diet.    

## 2014-06-07 NOTE — Progress Notes (Signed)
Echo performed. 

## 2014-06-07 NOTE — Progress Notes (Signed)
Patient ID: Dale Winters, male   DOB: 13-Feb-1941, 73 y.o.   MRN: [id number] Dale Winters is a 73 y.o. male with a history of cardiomyopathy, LBBB, HTN, HL. Last seen by me  05/2013. Seen by PA 6/15  Required some nasal steroids for congestion at that time now resolved  He feels back to baseline. He denies significant dyspnea. He is NYHA class II. He denies chest pain, syncope, near syncope, orthopnea, PND or edema.   Studies: - Echo (06/04/13): Septal and apical HK, EF 40-45%, PASP 36 mm Hg - Echo (07/2011): EF 45-50%  Reviewed and compared echo from today and EF about same 35-40% although abnormal septal motion makes it hard to judge. Has chronic dyspnea that may be slightly worse Worked in [employer] for years  Quit smoking in late 20's  Also exposed to "black soot" at [employer] in [place]     ROS: Denies fever, malais, weight loss, blurry vision, decreased visual acuity, cough, sputum, SOB, hemoptysis, pleuritic pain, palpitaitons, heartburn, abdominal pain, melena, lower extremity edema, claudication, or rash.  All other systems reviewed and negative  General: Affect appropriate Healthy:  appears stated age HEENT: normal Neck supple with no adenopathy JVP normal no bruits no thyromegaly Lungs interstitial crackles mid and base  no wheezing and good diaphragmatic motion Heart:  S1/S2 no murmur, no rub, gallop or click PMI normal Abdomen: benighn, BS positve, no tenderness, no AAA no bruit.  No HSM or HJR Distal pulses intact with no bruits No edema Neuro non-focal Skin warm and dry No muscular weakness   Current Outpatient Prescriptions  Medication Sig Dispense Refill  . aspirin 81 MG tablet Take 81 mg by mouth daily.    . clobetasol ointment (TEMOVATE) 0.05 % prn    . losartan (COZAAR) 100 MG tablet TAKE 1 TABLET BY MOUTH EVERY DAY 90 tablet 0  . Multiple Vitamin (MULTIVITAMIN) capsule Take 1 capsule by mouth daily.    Marland Kitchen omeprazole (PRILOSEC) 40 MG  capsule Take 1 capsule by mouth as needed.     No current facility-administered medications for this visit.    Allergies  Ace inhibitors  Electrocardiogram: 6/15  SR LBBB LAD no change from ECG done 08/17/11  Assessment and Plan

## 2014-06-07 NOTE — Assessment & Plan Note (Signed)
Occupational exposures  Abnormal exam  Will order high resolution CT to check for ILD and PFTls  Refer to pulmonary if abnormal CHF seems compensated and should not result in change in symptoms

## 2014-06-07 NOTE — Assessment & Plan Note (Signed)
Check BMET and BNP  EF about the same by echo  If BNP up can adjust meds

## 2014-06-10 ENCOUNTER — Ambulatory Visit (INDEPENDENT_AMBULATORY_CARE_PROVIDER_SITE_OTHER): Payer: Medicare Other | Admitting: Internal Medicine

## 2014-06-10 DIAGNOSIS — R062 Wheezing: Secondary | ICD-10-CM

## 2014-06-10 LAB — PULMONARY FUNCTION TEST
DL/VA % pred: 74 %
DL/VA: 3.39 ml/min/mmHg/L
DLCO UNC: 10.43 ml/min/mmHg
DLCO unc % pred: 33 %
FEF 25-75 PRE: 1.65 L/s
FEF 25-75 Post: 1.79 L/sec
FEF2575-%Change-Post: 8 %
FEF2575-%Pred-Post: 81 %
FEF2575-%Pred-Pre: 75 %
FEV1-%CHANGE-POST: 1 %
FEV1-%PRED-PRE: 60 %
FEV1-%Pred-Post: 62 %
FEV1-Post: 1.86 L
FEV1-Pre: 1.83 L
FEV1FVC-%Change-Post: 0 %
FEV1FVC-%Pred-Pre: 107 %
FEV6-%Change-Post: 0 %
FEV6-%PRED-PRE: 58 %
FEV6-%Pred-Post: 58 %
FEV6-Post: 2.28 L
FEV6-Pre: 2.26 L
FEV6FVC-%Change-Post: 0 %
FEV6FVC-%PRED-POST: 104 %
FEV6FVC-%PRED-PRE: 105 %
FVC-%Change-Post: 1 %
FVC-%PRED-POST: 56 %
FVC-%PRED-PRE: 56 %
FVC-Post: 2.35 L
FVC-Pre: 2.33 L
POST FEV1/FVC RATIO: 79 %
POST FEV6/FVC RATIO: 98 %
Pre FEV1/FVC ratio: 78 %
Pre FEV6/FVC Ratio: 99 %
RV % pred: 43 %
RV: 1.08 L
TLC % PRED: 47 %
TLC: 3.25 L

## 2014-06-10 NOTE — Progress Notes (Signed)
PFT done today. 

## 2014-06-12 ENCOUNTER — Ambulatory Visit (INDEPENDENT_AMBULATORY_CARE_PROVIDER_SITE_OTHER)
Admission: RE | Admit: 2014-06-12 | Discharge: 2014-06-12 | Disposition: A | Discharge status: Home / Self Care | Payer: Medicare Other | Source: Ambulatory Visit | Attending: Cardiovascular Disease | Admitting: Cardiovascular Disease

## 2014-06-12 DIAGNOSIS — R062 Wheezing: Secondary | ICD-10-CM

## 2014-06-12 DIAGNOSIS — R918 Other nonspecific abnormal finding of lung field: Secondary | ICD-10-CM | POA: Diagnosis not present

## 2014-06-12 DIAGNOSIS — R590 Localized enlarged lymph nodes: Secondary | ICD-10-CM | POA: Diagnosis not present

## 2014-06-12 DIAGNOSIS — R0602 Shortness of breath: Secondary | ICD-10-CM | POA: Diagnosis not present

## 2014-06-13 ENCOUNTER — Ambulatory Visit: Payer: Medicare Other | Admitting: Pulmonary Disease

## 2014-06-14 ENCOUNTER — Encounter: Payer: Self-pay | Admitting: Pulmonary Disease

## 2014-06-14 ENCOUNTER — Ambulatory Visit (INDEPENDENT_AMBULATORY_CARE_PROVIDER_SITE_OTHER): Payer: Medicare Other | Admitting: Pulmonary Disease

## 2014-06-14 ENCOUNTER — Other Ambulatory Visit (INDEPENDENT_AMBULATORY_CARE_PROVIDER_SITE_OTHER): Payer: Medicare Other

## 2014-06-14 VITALS — BP 122/70 | HR 83 | Ht 70.0 in | Wt 155.0 lb

## 2014-06-14 DIAGNOSIS — R0602 Shortness of breath: Secondary | ICD-10-CM

## 2014-06-14 DIAGNOSIS — I255 Ischemic cardiomyopathy: Secondary | ICD-10-CM

## 2014-06-14 DIAGNOSIS — J84112 Idiopathic pulmonary fibrosis: Secondary | ICD-10-CM | POA: Insufficient documentation

## 2014-06-14 LAB — CBC WITH DIFFERENTIAL/PLATELET
Basophils Absolute: 0.1 10*3/uL (ref 0.0–0.1)
Basophils Relative: 0.9 % (ref 0.0–3.0)
EOS PCT: 6.6 % — AB (ref 0.0–5.0)
Eosinophils Absolute: 0.6 10*3/uL (ref 0.0–0.7)
HCT: 47.8 % (ref 39.0–52.0)
HEMOGLOBIN: 16.4 g/dL (ref 13.0–17.0)
LYMPHS ABS: 0.6 10*3/uL — AB (ref 0.7–4.0)
Lymphocytes Relative: 6.9 % — ABNORMAL LOW (ref 12.0–46.0)
MCHC: 34.3 g/dL (ref 30.0–36.0)
MCV: 92.3 fl (ref 78.0–100.0)
MONOS PCT: 8.2 % (ref 3.0–12.0)
Monocytes Absolute: 0.8 10*3/uL (ref 0.1–1.0)
NEUTROS ABS: 7.2 10*3/uL (ref 1.4–7.7)
Neutrophils Relative %: 77.4 % — ABNORMAL HIGH (ref 43.0–77.0)
PLATELETS: 235 10*3/uL (ref 150.0–400.0)
RBC: 5.18 Mil/uL (ref 4.22–5.81)
RDW: 12.9 % (ref 11.5–15.5)
WBC: 9.4 10*3/uL (ref 4.0–10.5)

## 2014-06-14 LAB — SEDIMENTATION RATE: Sed Rate: 38 mm/hr — ABNORMAL HIGH (ref 0–22)

## 2014-06-14 NOTE — Assessment & Plan Note (Signed)
The radiologic pattern in the lower lobes with honeycombing appears to be diagnostic of idiopathic pulmonary fibrosis-I reviewed images and discussed with the radiologist. The upper lobe nodular opacities with calcification and the calcified lymphadenopathy points to old granulomatous disease. Question of progressive massive fibrosis was raised-but his occupational history is really does not confirm this. He does not seem to have significant exposure to silica dust. His chest x-ray from 2012 shows similar findings of lymphadenopathy. His wife does tell me that he was told 25 years ago about the lymphadenopathy, as such this may simply be residual granulomatous disease.  We'll obtain serology for inflammatory markers. He does not seem to require oxygen at the current time. If serology is negative, then he would be a candidate for anti-fibrotic agents such as perfenidone or ofev. I doubt that a bronchoscopy would be necessary here. I did not see significant groundglass or any indication of an infectious etiology

## 2014-06-14 NOTE — Progress Notes (Signed)
   Subjective:    Patient ID: Dale Winters, male    DOB: Aug 29, 1940, 73 y.o.   MRN: [id number]  HPI 73 year old remote smoker with cardiomyopathy presents for evaluation of interstitial lung disease noted on imaging. He is accompanied by his wife 54 years and his daughter today He reports dyspnea on exertion class II especially on walking uphill. He reports frequent throat clearing and a dry cough. He denies orthopnea or paroxysmal nocturnal dyspnea or pedal edema He denies wheezing or frequent chest colds. He reports onset of dyspnea about 2 years ago and feels that this may be mildly progressive. He reports difficulty swallowing and required esophageal dilatation in 2006 (stark). He denies history of hospitalization as a child for pneumonia. No history of tuberculosis contact He smoked for about 3 years in his 21s. 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   Echo in 05/2013 showed EF of 45%, PASP 36 septal and apical hypokinesis High-resolution CT chest showed bilateral bulky hilar and mediastinal calcified lymph nodes. There were confluent opacities in both upper lobes and scattered nodules with calcification. There was bibasal and subpleural reticulation with traction bronchiectasis and honeycombing suggestive of UIP pattern. This appeared progressive and compared to chest x-ray from 06/2011 Oxygen saturation dropped from 96-90% on walking and heart rate increased from 83-98. PFTs showed moderate intraparenchymal restriction with FVC of 56%, DLCO 47% and DLCO uncorrected of 33%. DLCO corrected for alveolar volume to 74%  Past Medical History  Diagnosis Date  . Appendicitis     1956  . CHF (congestive heart failure)   . Abnormal ECG   . HTN (hypertension)     Past Surgical History  Procedure Laterality Date  . Appendectomy       Allergies  Allergen Reactions  . Ace Inhibitors Cough    cough     Review of Systems Constitutional: negative for anorexia, fevers and sweats  Eyes: negative for irritation, redness and visual disturbance  Ears, nose, mouth, throat, and face: negative for earaches, epistaxis, nasal congestion and sore throat   Cardiovascular: negative for chest pain, dyspnea, lower extremity edema, orthopnea, palpitations and syncope  Gastrointestinal: negative for abdominal pain, constipation, diarrhea, melena, nausea and vomiting  Genitourinary:negative for dysuria, frequency and hematuria  Hematologic/lymphatic: negative for bleeding, easy bruising and lymphadenopathy  Musculoskeletal:negative for arthralgias, muscle weakness and stiff joints  Neurological: negative for coordination problems, gait problems, headaches and weakness  Endocrine: negative for diabetic symptoms including polydipsia, polyuria and weight loss       Objective:   Physical Exam  Gen. Pleasant, well-nourished, in no distress, normal affect ENT - no lesions, no post nasal drip Neck: No JVD, no thyromegaly, no carotid bruits Lungs: no use of accessory muscles, no dullness to percussion, bibasal coarse crackles, no rhonchi  Cardiovascular: Rhythm regular, heart sounds  normal, no murmurs or gallops, no peripheral edema Abdomen: soft and non-tender, no hepatosplenomegaly, BS normal. Musculoskeletal: No deformities, no cyanosis or clubbing Neuro:  alert, non focal       Assessment & Plan:

## 2014-06-14 NOTE — Patient Instructions (Signed)
You have fibrosis in your lungs Blood work  Check oxygen levels I will discuss with radiologist

## 2014-06-14 NOTE — Progress Notes (Signed)
Quick Note:  Pt is being seen by Dr. Vassie Loll now. Results printed and given to RA for review. ______

## 2014-06-17 LAB — CYCLIC CITRUL PEPTIDE ANTIBODY, IGG: Cyclic Citrullin Peptide Ab: <2 U/mL (ref 0.0–5.0)

## 2014-06-17 LAB — ANA: ANA: NEGATIVE

## 2014-07-12 DIAGNOSIS — J309 Allergic rhinitis, unspecified: Secondary | ICD-10-CM | POA: Diagnosis not present

## 2014-07-12 DIAGNOSIS — J069 Acute upper respiratory infection, unspecified: Secondary | ICD-10-CM | POA: Diagnosis not present

## 2014-08-05 ENCOUNTER — Ambulatory Visit: Payer: Medicare Other | Admitting: Pulmonary Disease

## 2014-08-12 DIAGNOSIS — J0110 Acute frontal sinusitis, unspecified: Secondary | ICD-10-CM | POA: Diagnosis not present

## 2014-08-26 ENCOUNTER — Other Ambulatory Visit: Payer: Self-pay | Admitting: Cardiovascular Disease

## 2014-09-02 ENCOUNTER — Encounter: Payer: Self-pay | Admitting: Pulmonary Disease

## 2014-09-02 ENCOUNTER — Ambulatory Visit (INDEPENDENT_AMBULATORY_CARE_PROVIDER_SITE_OTHER): Payer: Medicare Other | Admitting: Pulmonary Disease

## 2014-09-02 VITALS — BP 140/66 | HR 80 | Ht 70.0 in | Wt 153.2 lb

## 2014-09-02 DIAGNOSIS — J84112 Idiopathic pulmonary fibrosis: Secondary | ICD-10-CM

## 2014-09-02 MED ORDER — ALBUTEROL SULFATE 108 (90 BASE) MCG/ACT IN AEPB: 2.0000 | INHALATION_SPRAY | Freq: Four times a day (QID) | RESPIRATORY_TRACT | Status: DC | PRN

## 2014-09-02 NOTE — Assessment & Plan Note (Signed)
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

## 2014-09-02 NOTE — Progress Notes (Signed)
   Subjective:    Patient ID: Dale Winters, male    DOB: 25-Apr-1941, 74 y.o.   MRN: [id number]  HPI PCP - Humphrey Rolls  74 year old remote smoker with cardiomyopathyfor FU of interstitial lung disease noted on imaging. He is accompanied by his wife 59 years . He reports onset of dyspnea about 2013 and feels that this may be mildly progressive. He reports difficulty swallowing and required esophageal dilatation in 2006 (stark). He smoked for about 3 years in his 82s. 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    Significant tests/ events  Echo in 05/2013 showed EF of 45%, PASP 36 septal and apical hypokinesis High-resolution CT chest showed bilateral bulky hilar and mediastinal calcified lymph nodes. There were confluent opacities in both upper lobes and scattered nodules with calcification. There was bibasal and subpleural reticulation with traction bronchiectasis and honeycombing suggestive of UIP pattern. This appeared progressive and compared to chest x-ray from 06/2011 Oxygen saturation dropped from 96-90% on walking and heart rate increased from 83-98. PFTs showed moderate intraparenchymal restriction with FVC of 56%, DLCO 47% and DLCO uncorrected of 33%. DLCO corrected for alveolar volume to 74%  ESR neg, ANA neg, CCP neg  Chief Complaint  Patient presents with  . Follow-up    drainage in throat, spitting up yellow mucus sometimes. Discuss nebulizers.  Discuss Prevnar 13 shot.   Had sinus infection- took antibiotic, dry cough - some  Better  Able to sleep at night   Review of Systems neg for any significant sore throat, dysphagia, itching, sneezing, nasal congestion or excess/ purulent secretions, fever, chills, sweats, unintended wt loss, pleuritic or exertional cp, hempoptysis, orthopnea pnd or change in chronic leg swelling. Also  denies presyncope, palpitations, heartburn, abdominal pain, nausea, vomiting, diarrhea or change in bowel or urinary habits, dysuria,hematuria, rash, arthralgias, visual complaints, headache, numbness weakness or ataxia.     Objective:   Physical Exam  Gen. Pleasant, well-nourished, in no distress ENT - no lesions, no post nasal drip Neck: No JVD, no thyromegaly, no carotid bruits Lungs: no use of accessory muscles, no dullness to percussion, bibasal rales,no rhonchi  Cardiovascular: Rhythm regular, heart sounds  normal, no murmurs or gallops, no peripheral edema Musculoskeletal: No deformities, no cyanosis or clubbing        Assessment & Plan:

## 2014-09-02 NOTE — Addendum Note (Signed)
Addended by: York Ram on: 09/02/2014 02:27 PM   Modules accepted: Orders

## 2014-09-02 NOTE — Patient Instructions (Signed)
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

## 2014-09-04 ENCOUNTER — Other Ambulatory Visit: Payer: Self-pay | Admitting: *Deleted

## 2014-09-04 ENCOUNTER — Telehealth: Payer: Self-pay | Admitting: Pulmonary Disease

## 2014-09-04 DIAGNOSIS — J84112 Idiopathic pulmonary fibrosis: Secondary | ICD-10-CM

## 2014-09-04 NOTE — Telephone Encounter (Signed)
Spoke with pts daughter, states that he was given albuterol respiclick but pt is afraid to take this because he is afraid of developing thrush.  He is requesting the regular albuterol inhaler.  Are you ok with this switch?  Also, pt's daughter notes that pulmonary rehab has been mentioned before.  Pt's daughter is wanting to have pt start this, but to go through pulmonary rehab at [facility].  RA are you ok with this?  Thanks!

## 2014-09-04 NOTE — Telephone Encounter (Signed)
Patient notified.  Order entered for Rehab at [facility].  Nothing further needed.

## 2014-09-04 NOTE — Telephone Encounter (Signed)
OK with [place] Albuterol is not a steroid - does not cause thrush

## 2014-09-10 DIAGNOSIS — D7589 Other specified diseases of blood and blood-forming organs: Secondary | ICD-10-CM | POA: Diagnosis not present

## 2014-09-10 DIAGNOSIS — Z23 Encounter for immunization: Secondary | ICD-10-CM | POA: Diagnosis not present

## 2014-09-10 DIAGNOSIS — K219 Gastro-esophageal reflux disease without esophagitis: Secondary | ICD-10-CM | POA: Diagnosis not present

## 2014-09-10 DIAGNOSIS — Z125 Encounter for screening for malignant neoplasm of prostate: Secondary | ICD-10-CM | POA: Diagnosis not present

## 2014-09-10 DIAGNOSIS — I10 Essential (primary) hypertension: Secondary | ICD-10-CM | POA: Diagnosis not present

## 2014-09-10 DIAGNOSIS — Z79899 Other long term (current) drug therapy: Secondary | ICD-10-CM | POA: Diagnosis not present

## 2014-09-10 DIAGNOSIS — Z0000 Encounter for general adult medical examination without abnormal findings: Secondary | ICD-10-CM | POA: Diagnosis not present

## 2014-09-10 DIAGNOSIS — E780 Pure hypercholesterolemia: Secondary | ICD-10-CM | POA: Diagnosis not present

## 2014-09-29 ENCOUNTER — Other Ambulatory Visit: Payer: Self-pay | Admitting: Pulmonary Disease

## 2014-11-08 ENCOUNTER — Ambulatory Visit: Payer: Medicare Other | Admitting: Pulmonary Disease

## 2014-12-02 NOTE — Progress Notes (Addendum)
Patient ID: Dale Winters, male   DOB: 06/09/1941, 74 y.o.   MRN: [id number] Dale Winters is a 74 y.o. male with a history of cardiomyopathy, LBBB, HTN, HL Seen by PA 6/15  Required some nasal steroids for congestion at that time now resolved   Studies: Echo 05/2014  Study Conclusions  - Left ventricle: The cavity size was normal. Systolic function was mildly reduced. The estimated ejection fraction was in the range of 45% to 50%. Mild diffuse hypokinesis with no identifiable regional variations. Doppler parameters are consistent with abnormal left ventricular relaxation (grade 1 diastolic dysfunction). - Ventricular septum: Septal motion showed dyssynergy and paradox. These changes are consistent with intraventricular conduction delay. - Pulmonary arteries: Systolic pressure was mildly increased. PA peak pressure: 38 mm Hg (S).  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  All other systems reviewed and negative  General: Affect appropriate Healthy:  appears stated age HEENT: normal Neck supple with no adenopathy JVP normal no bruits no thyromegaly Lungs interstitial crackles mid and base  no wheezing and good diaphragmatic motion Heart:  S1/S2 no murmur, no rub, gallop or click PMI normal Abdomen: benighn, BS positve, no tenderness, no AAA no bruit.  No HSM or HJR Distal pulses intact with no bruits No edema Neuro non-focal Skin warm and dry No muscular weakness   Current Outpatient Prescriptions   Medication Sig Dispense Refill  . aspirin 81 MG tablet Take 81 mg by mouth daily.    Marland Kitchen losartan (COZAAR) 100 MG tablet TAKE 1 TABLET BY MOUTH DAILY 30 tablet 6  . Multiple Vitamin (MULTIVITAMIN) capsule Take 1 capsule by mouth daily.    Marland Kitchen omeprazole (PRILOSEC) 40 MG capsule Take 1 capsule by mouth as needed.    Marland Kitchen PROAIR RESPICLICK 108 (90 BASE) MCG/ACT AEPB INHALE 2 PUFFS BY MOUTH EVERY 6 HOURS AS NEEDED 1 each 3  . psyllium (METAMUCIL) 58.6 % packet Take 1 packet by mouth daily.    . vitamin B-12 (CYANOCOBALAMIN) 500 MCG tablet Take 1,000 mcg by mouth daily.      No current facility-administered medications for this visit.    Allergies  Ace inhibitors  Electrocardiogram: 6/15  SR LBBB LAD no change from ECG done 08/17/11  SR rate 72  LAD LBBB no change   Assessment and Plan ILD:  Stable despite markedly abnormal exam.  Rx with Ofev too expensive has had benefit from proair DCM:  Stable euvolemic  EF 45-50% last echo f/u in a year LBBB:  Chronic no change in ECG or high grade heart block HTN:  Well controlled.  Continue current medications and low sodium Dash type diet.   Chol:  On statin LDL 81 with normal LFTls  Dale Winters

## 2014-12-04 ENCOUNTER — Ambulatory Visit (INDEPENDENT_AMBULATORY_CARE_PROVIDER_SITE_OTHER): Payer: Medicare Other | Admitting: Cardiovascular Disease

## 2014-12-04 ENCOUNTER — Encounter: Payer: Self-pay | Admitting: Cardiovascular Disease

## 2014-12-04 VITALS — BP 132/74 | HR 72 | Ht 70.0 in | Wt 153.0 lb

## 2014-12-04 DIAGNOSIS — I159 Secondary hypertension, unspecified: Secondary | ICD-10-CM | POA: Diagnosis not present

## 2014-12-04 NOTE — Patient Instructions (Signed)
Medication Instructions:  NO CHANGES  Labwork: NONE  Testing/Procedures: NONE  Follow-Up: Your physician wants you to follow-up in: 6 MONTHS WITH DR NISHAN You will receive a reminder letter in the mail two months in advance. If you don't receive a letter, please call our office to schedule the follow-up appointment.  Any Other Special Instructions Will Be Listed Below (If Applicable).   

## 2014-12-05 DIAGNOSIS — J302 Other seasonal allergic rhinitis: Secondary | ICD-10-CM | POA: Diagnosis not present

## 2015-03-12 DIAGNOSIS — J0100 Acute maxillary sinusitis, unspecified: Secondary | ICD-10-CM | POA: Diagnosis not present

## 2015-05-21 DIAGNOSIS — Z23 Encounter for immunization: Secondary | ICD-10-CM | POA: Diagnosis not present

## 2015-05-26 DIAGNOSIS — H6091 Unspecified otitis externa, right ear: Secondary | ICD-10-CM | POA: Diagnosis not present

## 2015-05-26 DIAGNOSIS — J302 Other seasonal allergic rhinitis: Secondary | ICD-10-CM | POA: Diagnosis not present

## 2015-06-04 NOTE — Progress Notes (Signed)
Patient ID: Ralphael Jeanelle Malling, male   DOB: 02-28-1941, 74 y.o.   MRN: [id number] Everet B Olesen is a 74 y.o.  male with a history of cardiomyopathy, LBBB, HTN, HL Seen by PA 6/15  Required some nasal steroids for congestion at that time now resolved   Studies: Echo 05/2014  Study Conclusions  - Left ventricle: The cavity size was normal. Systolic function was mildly reduced. The estimated ejection fraction was in the range of 45% to 50%. Mild diffuse hypokinesis with no identifiable regional variations. Doppler parameters are consistent with abnormal left ventricular relaxation (grade 1 diastolic dysfunction). - Ventricular septum: Septal motion showed dyssynergy and paradox. These changes are consistent with intraventricular conduction delay. - Pulmonary arteries: Systolic pressure was mildly increased. PA peak pressure: 38 mm Hg (S).  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  All other systems reviewed and negative  General: Affect appropriate Healthy:  appears stated age HEENT: normal Neck supple with no adenopathy JVP normal no bruits no thyromegaly Lungs interstitial crackles mid and base  no wheezing and good diaphragmatic motion Heart:  S1/S2 no murmur, no rub, gallop or click PMI normal Abdomen: benighn, BS positve, no tenderness, no AAA no bruit.  No HSM or HJR Distal pulses intact with no bruits No edema Neuro non-focal Skin warm and dry No muscular weakness   Current Outpatient Prescriptions   Medication Sig Dispense Refill  . aspirin 81 MG tablet Take 81 mg by mouth daily.    Marland Kitchen losartan (COZAAR) 100 MG tablet TAKE 1 TABLET BY MOUTH DAILY 30 tablet 6  . Multiple Vitamin (MULTIVITAMIN) capsule Take 1 capsule by mouth daily.    Marland Kitchen omeprazole (PRILOSEC) 40 MG capsule Take 1 capsule by mouth daily as needed (for heartburn).     . psyllium (METAMUCIL) 58.6 % packet Take 1 packet by mouth daily as needed (for constipation).     . VENTOLIN HFA 108 (90 BASE) MCG/ACT inhaler Inhale 2 puffs into the lungs every 6 (six) hours as needed for wheezing or shortness of breath.     . vitamin B-12 (CYANOCOBALAMIN) 500 MCG tablet Take 1,000 mcg by mouth daily.      No current facility-administered medications for this visit.    Allergies  Ace inhibitors  Electrocardiogram: 6/15  SR LBBB LAD no change from ECG done 08/17/11    12/04/14  SR rate 72  LAD LBBB no change   Assessment and Plan ILD:  Stable despite markedly abnormal exam.  Rx with Ofev too expensive has had benefit from proair DCM:  Stable euvolemic  EF 45-50% last echo f/u  In 6 months  LBBB:  Chronic no change in ECG or high grade heart block HTN:  Well controlled.  Continue current medications and low sodium Dash type diet.   Chol:  On statin LDL 81 with normal LFTls  Will arrange pulmonary f/u   Charlton Haws

## 2015-06-05 ENCOUNTER — Ambulatory Visit (INDEPENDENT_AMBULATORY_CARE_PROVIDER_SITE_OTHER): Payer: Medicare Other | Admitting: Cardiovascular Disease

## 2015-06-05 ENCOUNTER — Encounter: Payer: Self-pay | Admitting: Cardiovascular Disease

## 2015-06-05 VITALS — BP 116/60 | HR 86 | Ht 70.0 in | Wt 158.8 lb

## 2015-06-05 DIAGNOSIS — R0602 Shortness of breath: Secondary | ICD-10-CM

## 2015-06-05 DIAGNOSIS — I5022 Chronic systolic (congestive) heart failure: Secondary | ICD-10-CM | POA: Diagnosis not present

## 2015-06-05 DIAGNOSIS — I429 Cardiomyopathy, unspecified: Secondary | ICD-10-CM | POA: Diagnosis not present

## 2015-06-05 NOTE — Patient Instructions (Signed)
Medication Instructions:   Your physician recommends that you continue on your current medications as directed. Please refer to the Current Medication list given to you today.   Labwork: NONE  Testing/Procedures:  6 MONTHS ECHO  Follow-Up: Your physician wants you to follow-up in: 6 MONTHS WITH DR. Eden Emms You will receive a reminder letter in the mail two months in advance. If you don't receive a letter, please call our office to schedule the follow-up appointment.   Any Other Special Instructions Will Be Listed Below (If Applicable).  SCHEDULE ECHO FOR 6 MONTHS SAME DAY AS NISHAN IN OFFICE    If you need a refill on your cardiac medications before your next appointment, please call your pharmacy.

## 2015-06-10 ENCOUNTER — Telehealth (HOSPITAL_COMMUNITY): Payer: Self-pay | Admitting: *Deleted

## 2015-09-08 DIAGNOSIS — Z125 Encounter for screening for malignant neoplasm of prostate: Secondary | ICD-10-CM | POA: Diagnosis not present

## 2015-09-08 DIAGNOSIS — E785 Hyperlipidemia, unspecified: Secondary | ICD-10-CM | POA: Diagnosis not present

## 2015-09-08 DIAGNOSIS — Z79899 Other long term (current) drug therapy: Secondary | ICD-10-CM | POA: Diagnosis not present

## 2015-09-12 DIAGNOSIS — J8410 Pulmonary fibrosis, unspecified: Secondary | ICD-10-CM | POA: Diagnosis not present

## 2015-09-12 DIAGNOSIS — Z0000 Encounter for general adult medical examination without abnormal findings: Secondary | ICD-10-CM | POA: Diagnosis not present

## 2015-09-12 DIAGNOSIS — K219 Gastro-esophageal reflux disease without esophagitis: Secondary | ICD-10-CM | POA: Diagnosis not present

## 2015-09-12 DIAGNOSIS — J302 Other seasonal allergic rhinitis: Secondary | ICD-10-CM | POA: Diagnosis not present

## 2015-09-12 DIAGNOSIS — I10 Essential (primary) hypertension: Secondary | ICD-10-CM | POA: Diagnosis not present

## 2015-12-12 DIAGNOSIS — J302 Other seasonal allergic rhinitis: Secondary | ICD-10-CM | POA: Diagnosis not present

## 2016-03-04 DIAGNOSIS — R0902 Hypoxemia: Secondary | ICD-10-CM | POA: Diagnosis not present

## 2016-03-04 DIAGNOSIS — Z1389 Encounter for screening for other disorder: Secondary | ICD-10-CM | POA: Diagnosis not present

## 2016-03-04 DIAGNOSIS — Z9981 Dependence on supplemental oxygen: Secondary | ICD-10-CM | POA: Diagnosis not present

## 2016-03-04 DIAGNOSIS — J8410 Pulmonary fibrosis, unspecified: Secondary | ICD-10-CM | POA: Diagnosis not present

## 2016-03-16 DIAGNOSIS — J64 Unspecified pneumoconiosis: Secondary | ICD-10-CM | POA: Diagnosis not present

## 2016-03-16 DIAGNOSIS — J84112 Idiopathic pulmonary fibrosis: Secondary | ICD-10-CM | POA: Diagnosis not present

## 2016-03-19 DIAGNOSIS — R590 Localized enlarged lymph nodes: Secondary | ICD-10-CM | POA: Diagnosis not present

## 2016-03-19 DIAGNOSIS — J84112 Idiopathic pulmonary fibrosis: Secondary | ICD-10-CM | POA: Diagnosis not present

## 2016-03-19 DIAGNOSIS — I700 Atherosclerosis of aorta: Secondary | ICD-10-CM | POA: Diagnosis not present

## 2016-03-22 DIAGNOSIS — J84112 Idiopathic pulmonary fibrosis: Secondary | ICD-10-CM | POA: Diagnosis not present

## 2016-03-30 DIAGNOSIS — J84112 Idiopathic pulmonary fibrosis: Secondary | ICD-10-CM | POA: Diagnosis not present

## 2016-03-30 DIAGNOSIS — J64 Unspecified pneumoconiosis: Secondary | ICD-10-CM | POA: Diagnosis not present

## 2016-04-20 DIAGNOSIS — J8410 Pulmonary fibrosis, unspecified: Secondary | ICD-10-CM | POA: Diagnosis not present

## 2016-04-21 DIAGNOSIS — J8410 Pulmonary fibrosis, unspecified: Secondary | ICD-10-CM | POA: Diagnosis not present

## 2016-04-30 DIAGNOSIS — B370 Candidal stomatitis: Secondary | ICD-10-CM | POA: Diagnosis not present

## 2016-04-30 DIAGNOSIS — J8410 Pulmonary fibrosis, unspecified: Secondary | ICD-10-CM | POA: Diagnosis not present

## 2016-05-07 DIAGNOSIS — J3089 Other allergic rhinitis: Secondary | ICD-10-CM | POA: Diagnosis not present

## 2016-05-11 DIAGNOSIS — J4530 Mild persistent asthma, uncomplicated: Secondary | ICD-10-CM | POA: Diagnosis not present

## 2016-05-11 DIAGNOSIS — J64 Unspecified pneumoconiosis: Secondary | ICD-10-CM | POA: Diagnosis not present

## 2016-05-11 DIAGNOSIS — R5383 Other fatigue: Secondary | ICD-10-CM | POA: Diagnosis not present

## 2016-05-25 DIAGNOSIS — Z23 Encounter for immunization: Secondary | ICD-10-CM | POA: Diagnosis not present

## 2016-08-18 DIAGNOSIS — J64 Unspecified pneumoconiosis: Secondary | ICD-10-CM | POA: Diagnosis not present

## 2016-08-18 DIAGNOSIS — J84112 Idiopathic pulmonary fibrosis: Secondary | ICD-10-CM | POA: Diagnosis not present

## 2016-08-18 DIAGNOSIS — J4530 Mild persistent asthma, uncomplicated: Secondary | ICD-10-CM | POA: Diagnosis not present

## 2016-08-30 DIAGNOSIS — J8410 Pulmonary fibrosis, unspecified: Secondary | ICD-10-CM | POA: Diagnosis not present

## 2016-08-30 DIAGNOSIS — J302 Other seasonal allergic rhinitis: Secondary | ICD-10-CM | POA: Diagnosis not present

## 2016-09-09 DIAGNOSIS — Z79899 Other long term (current) drug therapy: Secondary | ICD-10-CM | POA: Diagnosis not present

## 2016-09-09 DIAGNOSIS — E785 Hyperlipidemia, unspecified: Secondary | ICD-10-CM | POA: Diagnosis not present

## 2016-09-09 DIAGNOSIS — Z125 Encounter for screening for malignant neoplasm of prostate: Secondary | ICD-10-CM | POA: Diagnosis not present

## 2016-09-09 DIAGNOSIS — Z0000 Encounter for general adult medical examination without abnormal findings: Secondary | ICD-10-CM | POA: Diagnosis not present

## 2016-09-13 DIAGNOSIS — I429 Cardiomyopathy, unspecified: Secondary | ICD-10-CM | POA: Diagnosis not present

## 2016-09-13 DIAGNOSIS — J8410 Pulmonary fibrosis, unspecified: Secondary | ICD-10-CM | POA: Diagnosis not present

## 2016-09-13 DIAGNOSIS — K219 Gastro-esophageal reflux disease without esophagitis: Secondary | ICD-10-CM | POA: Diagnosis not present

## 2016-09-13 DIAGNOSIS — I10 Essential (primary) hypertension: Secondary | ICD-10-CM | POA: Diagnosis not present

## 2016-10-07 DIAGNOSIS — J4530 Mild persistent asthma, uncomplicated: Secondary | ICD-10-CM | POA: Diagnosis not present

## 2016-10-07 DIAGNOSIS — J64 Unspecified pneumoconiosis: Secondary | ICD-10-CM | POA: Diagnosis not present

## 2016-10-11 DIAGNOSIS — J301 Allergic rhinitis due to pollen: Secondary | ICD-10-CM | POA: Diagnosis not present

## 2016-10-11 DIAGNOSIS — J64 Unspecified pneumoconiosis: Secondary | ICD-10-CM | POA: Diagnosis not present

## 2016-10-11 DIAGNOSIS — J4530 Mild persistent asthma, uncomplicated: Secondary | ICD-10-CM | POA: Diagnosis not present

## 2016-12-03 DIAGNOSIS — H25813 Combined forms of age-related cataract, bilateral: Secondary | ICD-10-CM | POA: Diagnosis not present

## 2016-12-22 DIAGNOSIS — Z6820 Body mass index (BMI) 20.0-20.9, adult: Secondary | ICD-10-CM | POA: Diagnosis not present

## 2016-12-22 DIAGNOSIS — M5440 Lumbago with sciatica, unspecified side: Secondary | ICD-10-CM | POA: Diagnosis not present

## 2017-01-04 DIAGNOSIS — J4530 Mild persistent asthma, uncomplicated: Secondary | ICD-10-CM | POA: Diagnosis not present

## 2017-01-04 DIAGNOSIS — J301 Allergic rhinitis due to pollen: Secondary | ICD-10-CM | POA: Diagnosis not present

## 2017-02-24 DIAGNOSIS — M545 Low back pain: Secondary | ICD-10-CM | POA: Diagnosis not present

## 2017-02-24 DIAGNOSIS — Z6820 Body mass index (BMI) 20.0-20.9, adult: Secondary | ICD-10-CM | POA: Diagnosis not present

## 2017-03-07 DIAGNOSIS — L570 Actinic keratosis: Secondary | ICD-10-CM | POA: Diagnosis not present

## 2017-03-07 DIAGNOSIS — L720 Epidermal cyst: Secondary | ICD-10-CM | POA: Diagnosis not present

## 2017-04-13 DIAGNOSIS — J84112 Idiopathic pulmonary fibrosis: Secondary | ICD-10-CM | POA: Diagnosis not present

## 2017-04-13 DIAGNOSIS — J4530 Mild persistent asthma, uncomplicated: Secondary | ICD-10-CM | POA: Diagnosis not present

## 2017-04-25 DIAGNOSIS — Z681 Body mass index (BMI) 19 or less, adult: Secondary | ICD-10-CM | POA: Diagnosis not present

## 2017-04-25 DIAGNOSIS — J302 Other seasonal allergic rhinitis: Secondary | ICD-10-CM | POA: Diagnosis not present

## 2017-04-25 DIAGNOSIS — J8410 Pulmonary fibrosis, unspecified: Secondary | ICD-10-CM | POA: Diagnosis not present

## 2017-05-17 DIAGNOSIS — Z23 Encounter for immunization: Secondary | ICD-10-CM | POA: Diagnosis not present

## 2017-05-26 DIAGNOSIS — J8410 Pulmonary fibrosis, unspecified: Secondary | ICD-10-CM | POA: Diagnosis not present

## 2017-05-26 DIAGNOSIS — R634 Abnormal weight loss: Secondary | ICD-10-CM | POA: Diagnosis not present

## 2017-05-26 DIAGNOSIS — Z1339 Encounter for screening examination for other mental health and behavioral disorders: Secondary | ICD-10-CM | POA: Diagnosis not present

## 2017-05-26 DIAGNOSIS — Z681 Body mass index (BMI) 19 or less, adult: Secondary | ICD-10-CM | POA: Diagnosis not present

## 2017-06-01 DIAGNOSIS — J8410 Pulmonary fibrosis, unspecified: Secondary | ICD-10-CM | POA: Diagnosis not present

## 2017-06-01 DIAGNOSIS — R0902 Hypoxemia: Secondary | ICD-10-CM | POA: Diagnosis not present

## 2017-06-01 DIAGNOSIS — I951 Orthostatic hypotension: Secondary | ICD-10-CM | POA: Diagnosis not present

## 2017-06-01 DIAGNOSIS — Z9981 Dependence on supplemental oxygen: Secondary | ICD-10-CM | POA: Diagnosis not present

## 2017-06-09 DIAGNOSIS — R0609 Other forms of dyspnea: Secondary | ICD-10-CM | POA: Diagnosis not present

## 2017-06-09 DIAGNOSIS — I429 Cardiomyopathy, unspecified: Secondary | ICD-10-CM

## 2017-07-06 DIAGNOSIS — J4530 Mild persistent asthma, uncomplicated: Secondary | ICD-10-CM | POA: Diagnosis not present

## 2017-07-06 DIAGNOSIS — J84112 Idiopathic pulmonary fibrosis: Secondary | ICD-10-CM | POA: Diagnosis not present

## 2017-07-14 DIAGNOSIS — R918 Other nonspecific abnormal finding of lung field: Secondary | ICD-10-CM | POA: Diagnosis not present

## 2017-07-14 DIAGNOSIS — J84112 Idiopathic pulmonary fibrosis: Secondary | ICD-10-CM | POA: Diagnosis not present

## 2017-08-16 DIAGNOSIS — J64 Unspecified pneumoconiosis: Secondary | ICD-10-CM | POA: Diagnosis not present

## 2017-08-16 DIAGNOSIS — J4530 Mild persistent asthma, uncomplicated: Secondary | ICD-10-CM | POA: Diagnosis not present

## 2017-08-16 DIAGNOSIS — J84112 Idiopathic pulmonary fibrosis: Secondary | ICD-10-CM | POA: Diagnosis not present

## 2017-09-12 DIAGNOSIS — Z79899 Other long term (current) drug therapy: Secondary | ICD-10-CM | POA: Diagnosis not present

## 2017-09-12 DIAGNOSIS — Z125 Encounter for screening for malignant neoplasm of prostate: Secondary | ICD-10-CM | POA: Diagnosis not present

## 2017-09-12 DIAGNOSIS — E785 Hyperlipidemia, unspecified: Secondary | ICD-10-CM | POA: Diagnosis not present

## 2017-09-15 DIAGNOSIS — J302 Other seasonal allergic rhinitis: Secondary | ICD-10-CM | POA: Diagnosis not present

## 2017-09-15 DIAGNOSIS — J8410 Pulmonary fibrosis, unspecified: Secondary | ICD-10-CM | POA: Diagnosis not present

## 2017-09-15 DIAGNOSIS — K219 Gastro-esophageal reflux disease without esophagitis: Secondary | ICD-10-CM | POA: Diagnosis not present

## 2017-09-15 DIAGNOSIS — Z0000 Encounter for general adult medical examination without abnormal findings: Secondary | ICD-10-CM | POA: Diagnosis not present

## 2017-09-15 DIAGNOSIS — I10 Essential (primary) hypertension: Secondary | ICD-10-CM | POA: Diagnosis not present

## 2017-10-01 DIAGNOSIS — R1013 Epigastric pain: Secondary | ICD-10-CM | POA: Diagnosis not present

## 2017-10-01 DIAGNOSIS — R0602 Shortness of breath: Secondary | ICD-10-CM | POA: Diagnosis not present

## 2017-10-01 DIAGNOSIS — K2900 Acute gastritis without bleeding: Secondary | ICD-10-CM | POA: Diagnosis not present

## 2017-11-21 DIAGNOSIS — J329 Chronic sinusitis, unspecified: Secondary | ICD-10-CM | POA: Diagnosis not present

## 2017-11-21 DIAGNOSIS — J40 Bronchitis, not specified as acute or chronic: Secondary | ICD-10-CM | POA: Diagnosis not present

## 2017-12-26 DIAGNOSIS — J8410 Pulmonary fibrosis, unspecified: Secondary | ICD-10-CM | POA: Diagnosis not present

## 2017-12-26 DIAGNOSIS — I951 Orthostatic hypotension: Secondary | ICD-10-CM | POA: Diagnosis not present

## 2017-12-26 DIAGNOSIS — Z681 Body mass index (BMI) 19 or less, adult: Secondary | ICD-10-CM | POA: Diagnosis not present

## 2018-01-05 DIAGNOSIS — J8410 Pulmonary fibrosis, unspecified: Secondary | ICD-10-CM | POA: Diagnosis not present

## 2018-01-05 DIAGNOSIS — Z681 Body mass index (BMI) 19 or less, adult: Secondary | ICD-10-CM | POA: Diagnosis not present

## 2018-01-11 DIAGNOSIS — Z8679 Personal history of other diseases of the circulatory system: Secondary | ICD-10-CM | POA: Diagnosis not present

## 2018-01-11 DIAGNOSIS — J8410 Pulmonary fibrosis, unspecified: Secondary | ICD-10-CM | POA: Diagnosis not present

## 2018-01-11 DIAGNOSIS — Z8639 Personal history of other endocrine, nutritional and metabolic disease: Secondary | ICD-10-CM | POA: Diagnosis not present

## 2018-01-11 DIAGNOSIS — Z8701 Personal history of pneumonia (recurrent): Secondary | ICD-10-CM | POA: Diagnosis not present

## 2018-01-12 DIAGNOSIS — Z8679 Personal history of other diseases of the circulatory system: Secondary | ICD-10-CM | POA: Diagnosis not present

## 2018-01-12 DIAGNOSIS — Z8639 Personal history of other endocrine, nutritional and metabolic disease: Secondary | ICD-10-CM | POA: Diagnosis not present

## 2018-01-12 DIAGNOSIS — Z8701 Personal history of pneumonia (recurrent): Secondary | ICD-10-CM | POA: Diagnosis not present

## 2018-01-12 DIAGNOSIS — J8410 Pulmonary fibrosis, unspecified: Secondary | ICD-10-CM | POA: Diagnosis not present

## 2018-01-17 DIAGNOSIS — J8410 Pulmonary fibrosis, unspecified: Secondary | ICD-10-CM | POA: Diagnosis not present

## 2018-01-17 DIAGNOSIS — Z8701 Personal history of pneumonia (recurrent): Secondary | ICD-10-CM | POA: Diagnosis not present

## 2018-01-17 DIAGNOSIS — Z8639 Personal history of other endocrine, nutritional and metabolic disease: Secondary | ICD-10-CM | POA: Diagnosis not present

## 2018-01-17 DIAGNOSIS — Z8679 Personal history of other diseases of the circulatory system: Secondary | ICD-10-CM | POA: Diagnosis not present

## 2018-01-23 DIAGNOSIS — Z8639 Personal history of other endocrine, nutritional and metabolic disease: Secondary | ICD-10-CM | POA: Diagnosis not present

## 2018-01-23 DIAGNOSIS — Z8679 Personal history of other diseases of the circulatory system: Secondary | ICD-10-CM | POA: Diagnosis not present

## 2018-01-23 DIAGNOSIS — J8410 Pulmonary fibrosis, unspecified: Secondary | ICD-10-CM | POA: Diagnosis not present

## 2018-01-23 DIAGNOSIS — Z8701 Personal history of pneumonia (recurrent): Secondary | ICD-10-CM | POA: Diagnosis not present

## 2018-01-30 DIAGNOSIS — J8410 Pulmonary fibrosis, unspecified: Secondary | ICD-10-CM | POA: Diagnosis not present

## 2018-01-30 DIAGNOSIS — Z8701 Personal history of pneumonia (recurrent): Secondary | ICD-10-CM | POA: Diagnosis not present

## 2018-01-30 DIAGNOSIS — Z8639 Personal history of other endocrine, nutritional and metabolic disease: Secondary | ICD-10-CM | POA: Diagnosis not present

## 2018-01-30 DIAGNOSIS — Z8679 Personal history of other diseases of the circulatory system: Secondary | ICD-10-CM | POA: Diagnosis not present

## 2018-02-02 DIAGNOSIS — Z8701 Personal history of pneumonia (recurrent): Secondary | ICD-10-CM | POA: Diagnosis not present

## 2018-02-02 DIAGNOSIS — J8410 Pulmonary fibrosis, unspecified: Secondary | ICD-10-CM | POA: Diagnosis not present

## 2018-02-02 DIAGNOSIS — Z8679 Personal history of other diseases of the circulatory system: Secondary | ICD-10-CM | POA: Diagnosis not present

## 2018-02-02 DIAGNOSIS — Z8639 Personal history of other endocrine, nutritional and metabolic disease: Secondary | ICD-10-CM | POA: Diagnosis not present

## 2018-02-06 DIAGNOSIS — Z8679 Personal history of other diseases of the circulatory system: Secondary | ICD-10-CM | POA: Diagnosis not present

## 2018-02-06 DIAGNOSIS — J8410 Pulmonary fibrosis, unspecified: Secondary | ICD-10-CM | POA: Diagnosis not present

## 2018-02-06 DIAGNOSIS — Z8639 Personal history of other endocrine, nutritional and metabolic disease: Secondary | ICD-10-CM | POA: Diagnosis not present

## 2018-02-06 DIAGNOSIS — Z8701 Personal history of pneumonia (recurrent): Secondary | ICD-10-CM | POA: Diagnosis not present

## 2018-02-08 DIAGNOSIS — J8410 Pulmonary fibrosis, unspecified: Secondary | ICD-10-CM | POA: Diagnosis not present

## 2018-02-08 DIAGNOSIS — Z8701 Personal history of pneumonia (recurrent): Secondary | ICD-10-CM | POA: Diagnosis not present

## 2018-02-08 DIAGNOSIS — Z8639 Personal history of other endocrine, nutritional and metabolic disease: Secondary | ICD-10-CM | POA: Diagnosis not present

## 2018-02-08 DIAGNOSIS — Z8679 Personal history of other diseases of the circulatory system: Secondary | ICD-10-CM | POA: Diagnosis not present

## 2018-02-13 DIAGNOSIS — Z8639 Personal history of other endocrine, nutritional and metabolic disease: Secondary | ICD-10-CM | POA: Diagnosis not present

## 2018-02-13 DIAGNOSIS — Z8679 Personal history of other diseases of the circulatory system: Secondary | ICD-10-CM | POA: Diagnosis not present

## 2018-02-13 DIAGNOSIS — J8410 Pulmonary fibrosis, unspecified: Secondary | ICD-10-CM | POA: Diagnosis not present

## 2018-02-13 DIAGNOSIS — Z8701 Personal history of pneumonia (recurrent): Secondary | ICD-10-CM | POA: Diagnosis not present

## 2018-02-14 DIAGNOSIS — J8410 Pulmonary fibrosis, unspecified: Secondary | ICD-10-CM | POA: Diagnosis not present

## 2018-02-14 DIAGNOSIS — I951 Orthostatic hypotension: Secondary | ICD-10-CM | POA: Diagnosis not present

## 2018-02-14 DIAGNOSIS — Z8679 Personal history of other diseases of the circulatory system: Secondary | ICD-10-CM | POA: Diagnosis not present

## 2018-02-14 DIAGNOSIS — Z6820 Body mass index (BMI) 20.0-20.9, adult: Secondary | ICD-10-CM | POA: Diagnosis not present

## 2018-02-14 DIAGNOSIS — Z8701 Personal history of pneumonia (recurrent): Secondary | ICD-10-CM | POA: Diagnosis not present

## 2018-02-14 DIAGNOSIS — Z8639 Personal history of other endocrine, nutritional and metabolic disease: Secondary | ICD-10-CM | POA: Diagnosis not present

## 2018-02-20 DIAGNOSIS — Z8701 Personal history of pneumonia (recurrent): Secondary | ICD-10-CM | POA: Diagnosis not present

## 2018-02-20 DIAGNOSIS — Z8679 Personal history of other diseases of the circulatory system: Secondary | ICD-10-CM | POA: Diagnosis not present

## 2018-02-20 DIAGNOSIS — Z8639 Personal history of other endocrine, nutritional and metabolic disease: Secondary | ICD-10-CM | POA: Diagnosis not present

## 2018-02-20 DIAGNOSIS — J8410 Pulmonary fibrosis, unspecified: Secondary | ICD-10-CM | POA: Diagnosis not present

## 2018-02-23 DIAGNOSIS — Z8639 Personal history of other endocrine, nutritional and metabolic disease: Secondary | ICD-10-CM | POA: Diagnosis not present

## 2018-02-23 DIAGNOSIS — Z8679 Personal history of other diseases of the circulatory system: Secondary | ICD-10-CM | POA: Diagnosis not present

## 2018-02-23 DIAGNOSIS — Z8701 Personal history of pneumonia (recurrent): Secondary | ICD-10-CM | POA: Diagnosis not present

## 2018-02-23 DIAGNOSIS — J8410 Pulmonary fibrosis, unspecified: Secondary | ICD-10-CM | POA: Diagnosis not present

## 2018-02-28 DIAGNOSIS — Z8639 Personal history of other endocrine, nutritional and metabolic disease: Secondary | ICD-10-CM | POA: Diagnosis not present

## 2018-02-28 DIAGNOSIS — J8410 Pulmonary fibrosis, unspecified: Secondary | ICD-10-CM | POA: Diagnosis not present

## 2018-02-28 DIAGNOSIS — Z8701 Personal history of pneumonia (recurrent): Secondary | ICD-10-CM | POA: Diagnosis not present

## 2018-02-28 DIAGNOSIS — Z8679 Personal history of other diseases of the circulatory system: Secondary | ICD-10-CM | POA: Diagnosis not present

## 2018-03-02 DIAGNOSIS — J8410 Pulmonary fibrosis, unspecified: Secondary | ICD-10-CM | POA: Diagnosis not present

## 2018-03-02 DIAGNOSIS — Z8679 Personal history of other diseases of the circulatory system: Secondary | ICD-10-CM | POA: Diagnosis not present

## 2018-03-02 DIAGNOSIS — Z8701 Personal history of pneumonia (recurrent): Secondary | ICD-10-CM | POA: Diagnosis not present

## 2018-03-02 DIAGNOSIS — Z8639 Personal history of other endocrine, nutritional and metabolic disease: Secondary | ICD-10-CM | POA: Diagnosis not present

## 2018-03-07 DIAGNOSIS — Z8701 Personal history of pneumonia (recurrent): Secondary | ICD-10-CM | POA: Diagnosis not present

## 2018-03-07 DIAGNOSIS — Z8639 Personal history of other endocrine, nutritional and metabolic disease: Secondary | ICD-10-CM | POA: Diagnosis not present

## 2018-03-07 DIAGNOSIS — J8410 Pulmonary fibrosis, unspecified: Secondary | ICD-10-CM | POA: Diagnosis not present

## 2018-03-07 DIAGNOSIS — Z8679 Personal history of other diseases of the circulatory system: Secondary | ICD-10-CM | POA: Diagnosis not present

## 2018-03-13 DIAGNOSIS — Z8701 Personal history of pneumonia (recurrent): Secondary | ICD-10-CM | POA: Diagnosis not present

## 2018-03-13 DIAGNOSIS — J8410 Pulmonary fibrosis, unspecified: Secondary | ICD-10-CM | POA: Diagnosis not present

## 2018-03-13 DIAGNOSIS — Z8679 Personal history of other diseases of the circulatory system: Secondary | ICD-10-CM | POA: Diagnosis not present

## 2018-03-13 DIAGNOSIS — Z8639 Personal history of other endocrine, nutritional and metabolic disease: Secondary | ICD-10-CM | POA: Diagnosis not present

## 2018-03-22 DIAGNOSIS — Z8679 Personal history of other diseases of the circulatory system: Secondary | ICD-10-CM | POA: Diagnosis not present

## 2018-03-22 DIAGNOSIS — J8410 Pulmonary fibrosis, unspecified: Secondary | ICD-10-CM | POA: Diagnosis not present

## 2018-03-22 DIAGNOSIS — Z8701 Personal history of pneumonia (recurrent): Secondary | ICD-10-CM | POA: Diagnosis not present

## 2018-03-22 DIAGNOSIS — Z8639 Personal history of other endocrine, nutritional and metabolic disease: Secondary | ICD-10-CM | POA: Diagnosis not present

## 2018-03-26 DIAGNOSIS — Z8639 Personal history of other endocrine, nutritional and metabolic disease: Secondary | ICD-10-CM | POA: Diagnosis not present

## 2018-03-26 DIAGNOSIS — J8410 Pulmonary fibrosis, unspecified: Secondary | ICD-10-CM | POA: Diagnosis not present

## 2018-03-26 DIAGNOSIS — Z8701 Personal history of pneumonia (recurrent): Secondary | ICD-10-CM | POA: Diagnosis not present

## 2018-03-26 DIAGNOSIS — Z8679 Personal history of other diseases of the circulatory system: Secondary | ICD-10-CM | POA: Diagnosis not present

## 2018-03-29 DIAGNOSIS — J8410 Pulmonary fibrosis, unspecified: Secondary | ICD-10-CM | POA: Diagnosis not present

## 2018-03-29 DIAGNOSIS — Z8639 Personal history of other endocrine, nutritional and metabolic disease: Secondary | ICD-10-CM | POA: Diagnosis not present

## 2018-03-29 DIAGNOSIS — Z8679 Personal history of other diseases of the circulatory system: Secondary | ICD-10-CM | POA: Diagnosis not present

## 2018-03-29 DIAGNOSIS — Z8701 Personal history of pneumonia (recurrent): Secondary | ICD-10-CM | POA: Diagnosis not present

## 2018-04-05 DIAGNOSIS — Z8639 Personal history of other endocrine, nutritional and metabolic disease: Secondary | ICD-10-CM | POA: Diagnosis not present

## 2018-04-05 DIAGNOSIS — J8410 Pulmonary fibrosis, unspecified: Secondary | ICD-10-CM | POA: Diagnosis not present

## 2018-04-05 DIAGNOSIS — Z8679 Personal history of other diseases of the circulatory system: Secondary | ICD-10-CM | POA: Diagnosis not present

## 2018-04-05 DIAGNOSIS — Z8701 Personal history of pneumonia (recurrent): Secondary | ICD-10-CM | POA: Diagnosis not present

## 2018-04-06 DIAGNOSIS — Z23 Encounter for immunization: Secondary | ICD-10-CM | POA: Diagnosis not present

## 2018-04-06 DIAGNOSIS — R0609 Other forms of dyspnea: Secondary | ICD-10-CM | POA: Diagnosis not present

## 2018-04-06 DIAGNOSIS — Z681 Body mass index (BMI) 19 or less, adult: Secondary | ICD-10-CM | POA: Diagnosis not present

## 2018-04-06 DIAGNOSIS — R69 Illness, unspecified: Secondary | ICD-10-CM | POA: Diagnosis not present

## 2018-04-06 DIAGNOSIS — J8410 Pulmonary fibrosis, unspecified: Secondary | ICD-10-CM | POA: Diagnosis not present

## 2018-04-08 DIAGNOSIS — Z8639 Personal history of other endocrine, nutritional and metabolic disease: Secondary | ICD-10-CM | POA: Diagnosis not present

## 2018-04-08 DIAGNOSIS — Z8679 Personal history of other diseases of the circulatory system: Secondary | ICD-10-CM | POA: Diagnosis not present

## 2018-04-08 DIAGNOSIS — Z8701 Personal history of pneumonia (recurrent): Secondary | ICD-10-CM | POA: Diagnosis not present

## 2018-04-08 DIAGNOSIS — J8410 Pulmonary fibrosis, unspecified: Secondary | ICD-10-CM | POA: Diagnosis not present

## 2018-04-12 DIAGNOSIS — J8410 Pulmonary fibrosis, unspecified: Secondary | ICD-10-CM | POA: Diagnosis not present

## 2018-04-12 DIAGNOSIS — Z8701 Personal history of pneumonia (recurrent): Secondary | ICD-10-CM | POA: Diagnosis not present

## 2018-04-12 DIAGNOSIS — Z8639 Personal history of other endocrine, nutritional and metabolic disease: Secondary | ICD-10-CM | POA: Diagnosis not present

## 2018-04-12 DIAGNOSIS — Z8679 Personal history of other diseases of the circulatory system: Secondary | ICD-10-CM | POA: Diagnosis not present

## 2018-04-19 DIAGNOSIS — Z8639 Personal history of other endocrine, nutritional and metabolic disease: Secondary | ICD-10-CM | POA: Diagnosis not present

## 2018-04-19 DIAGNOSIS — Z8701 Personal history of pneumonia (recurrent): Secondary | ICD-10-CM | POA: Diagnosis not present

## 2018-04-19 DIAGNOSIS — Z8679 Personal history of other diseases of the circulatory system: Secondary | ICD-10-CM | POA: Diagnosis not present

## 2018-04-19 DIAGNOSIS — J8410 Pulmonary fibrosis, unspecified: Secondary | ICD-10-CM | POA: Diagnosis not present

## 2018-04-25 DIAGNOSIS — Z8639 Personal history of other endocrine, nutritional and metabolic disease: Secondary | ICD-10-CM | POA: Diagnosis not present

## 2018-04-25 DIAGNOSIS — Z8701 Personal history of pneumonia (recurrent): Secondary | ICD-10-CM | POA: Diagnosis not present

## 2018-04-25 DIAGNOSIS — I442 Atrioventricular block, complete: Secondary | ICD-10-CM

## 2018-04-25 DIAGNOSIS — J8410 Pulmonary fibrosis, unspecified: Secondary | ICD-10-CM | POA: Diagnosis not present

## 2018-04-25 DIAGNOSIS — Z8679 Personal history of other diseases of the circulatory system: Secondary | ICD-10-CM | POA: Diagnosis not present

## 2018-04-25 HISTORY — DX: Atrioventricular block, complete: I44.2

## 2018-04-26 DIAGNOSIS — J8410 Pulmonary fibrosis, unspecified: Secondary | ICD-10-CM | POA: Diagnosis not present

## 2018-04-26 DIAGNOSIS — Z8639 Personal history of other endocrine, nutritional and metabolic disease: Secondary | ICD-10-CM | POA: Diagnosis not present

## 2018-04-26 DIAGNOSIS — Z8701 Personal history of pneumonia (recurrent): Secondary | ICD-10-CM | POA: Diagnosis not present

## 2018-04-26 DIAGNOSIS — Z8679 Personal history of other diseases of the circulatory system: Secondary | ICD-10-CM | POA: Diagnosis not present

## 2018-05-03 DIAGNOSIS — J8410 Pulmonary fibrosis, unspecified: Secondary | ICD-10-CM | POA: Diagnosis not present

## 2018-05-03 DIAGNOSIS — Z8701 Personal history of pneumonia (recurrent): Secondary | ICD-10-CM | POA: Diagnosis not present

## 2018-05-03 DIAGNOSIS — Z8679 Personal history of other diseases of the circulatory system: Secondary | ICD-10-CM | POA: Diagnosis not present

## 2018-05-03 DIAGNOSIS — Z8639 Personal history of other endocrine, nutritional and metabolic disease: Secondary | ICD-10-CM | POA: Diagnosis not present

## 2018-05-08 DIAGNOSIS — Z8701 Personal history of pneumonia (recurrent): Secondary | ICD-10-CM | POA: Diagnosis not present

## 2018-05-08 DIAGNOSIS — Z8639 Personal history of other endocrine, nutritional and metabolic disease: Secondary | ICD-10-CM | POA: Diagnosis not present

## 2018-05-08 DIAGNOSIS — Z8679 Personal history of other diseases of the circulatory system: Secondary | ICD-10-CM | POA: Diagnosis not present

## 2018-05-08 DIAGNOSIS — J8410 Pulmonary fibrosis, unspecified: Secondary | ICD-10-CM | POA: Diagnosis not present

## 2018-05-15 DIAGNOSIS — Z8639 Personal history of other endocrine, nutritional and metabolic disease: Secondary | ICD-10-CM | POA: Diagnosis not present

## 2018-05-15 DIAGNOSIS — J8410 Pulmonary fibrosis, unspecified: Secondary | ICD-10-CM | POA: Diagnosis not present

## 2018-05-15 DIAGNOSIS — Z8701 Personal history of pneumonia (recurrent): Secondary | ICD-10-CM | POA: Diagnosis not present

## 2018-05-15 DIAGNOSIS — Z8679 Personal history of other diseases of the circulatory system: Secondary | ICD-10-CM | POA: Diagnosis not present

## 2018-05-19 ENCOUNTER — Emergency Department (HOSPITAL_COMMUNITY): Payer: Medicare Other

## 2018-05-19 ENCOUNTER — Emergency Department (HOSPITAL_COMMUNITY)
Admission: EM | Admit: 2018-05-19 | Discharge: 2018-05-19 | Disposition: A | Discharge status: Home / Self Care | Payer: Medicare Other | Attending: Emergency Medicine | Admitting: Emergency Medicine

## 2018-05-19 ENCOUNTER — Other Ambulatory Visit: Payer: Self-pay

## 2018-05-19 DIAGNOSIS — R0602 Shortness of breath: Secondary | ICD-10-CM | POA: Diagnosis not present

## 2018-05-19 DIAGNOSIS — I4430 Unspecified atrioventricular block: Secondary | ICD-10-CM | POA: Diagnosis not present

## 2018-05-19 DIAGNOSIS — I5022 Chronic systolic (congestive) heart failure: Secondary | ICD-10-CM | POA: Insufficient documentation

## 2018-05-19 DIAGNOSIS — I110 Hypertensive heart disease with heart failure: Secondary | ICD-10-CM | POA: Insufficient documentation

## 2018-05-19 DIAGNOSIS — R079 Chest pain, unspecified: Secondary | ICD-10-CM | POA: Diagnosis not present

## 2018-05-19 DIAGNOSIS — Z87891 Personal history of nicotine dependence: Secondary | ICD-10-CM | POA: Insufficient documentation

## 2018-05-19 DIAGNOSIS — Z79899 Other long term (current) drug therapy: Secondary | ICD-10-CM | POA: Insufficient documentation

## 2018-05-19 DIAGNOSIS — R0989 Other specified symptoms and signs involving the circulatory and respiratory systems: Secondary | ICD-10-CM | POA: Diagnosis not present

## 2018-05-19 DIAGNOSIS — J8410 Pulmonary fibrosis, unspecified: Secondary | ICD-10-CM | POA: Diagnosis not present

## 2018-05-19 DIAGNOSIS — I447 Left bundle-branch block, unspecified: Secondary | ICD-10-CM | POA: Diagnosis not present

## 2018-05-19 DIAGNOSIS — R42 Dizziness and giddiness: Secondary | ICD-10-CM | POA: Diagnosis not present

## 2018-05-19 DIAGNOSIS — I441 Atrioventricular block, second degree: Secondary | ICD-10-CM | POA: Diagnosis not present

## 2018-05-19 DIAGNOSIS — Z681 Body mass index (BMI) 19 or less, adult: Secondary | ICD-10-CM | POA: Diagnosis not present

## 2018-05-19 LAB — CBC WITH DIFFERENTIAL/PLATELET
ABS IMMATURE GRANULOCYTES: 0.03 10*3/uL (ref 0.00–0.07)
Basophils Absolute: 0.1 10*3/uL (ref 0.0–0.1)
Basophils Relative: 1 %
EOS PCT: 4 %
Eosinophils Absolute: 0.3 10*3/uL (ref 0.0–0.5)
HCT: 43 % (ref 39.0–52.0)
Hemoglobin: 14 g/dL (ref 13.0–17.0)
Immature Granulocytes: 0 %
Lymphocytes Relative: 6 %
Lymphs Abs: 0.4 10*3/uL — ABNORMAL LOW (ref 0.7–4.0)
MCH: 30.6 pg (ref 26.0–34.0)
MCHC: 32.6 g/dL (ref 30.0–36.0)
MCV: 94.1 fL (ref 80.0–100.0)
MONO ABS: 0.7 10*3/uL (ref 0.1–1.0)
Monocytes Relative: 10 %
NEUTROS ABS: 5.8 10*3/uL (ref 1.7–7.7)
Neutrophils Relative %: 79 %
Platelets: 187 10*3/uL (ref 150–400)
RBC: 4.57 MIL/uL (ref 4.22–5.81)
RDW: 12.4 % (ref 11.5–15.5)
WBC: 7.4 10*3/uL (ref 4.0–10.5)
nRBC: 0 % (ref 0.0–0.2)

## 2018-05-19 LAB — COMPREHENSIVE METABOLIC PANEL
ALT: 15 U/L (ref 0–44)
ANION GAP: 7 (ref 5–15)
AST: 46 U/L — ABNORMAL HIGH (ref 15–41)
Albumin: 3.4 g/dL — ABNORMAL LOW (ref 3.5–5.0)
Alkaline Phosphatase: 82 U/L (ref 38–126)
BUN: 7 mg/dL — ABNORMAL LOW (ref 8–23)
CHLORIDE: 107 mmol/L (ref 98–111)
CO2: 28 mmol/L (ref 22–32)
Calcium: 9.1 mg/dL (ref 8.9–10.3)
Creatinine, Ser: 0.84 mg/dL (ref 0.61–1.24)
GFR calc non Af Amer: >60 mL/min (ref >60)
Glucose, Bld: 100 mg/dL — ABNORMAL HIGH (ref 70–99)
POTASSIUM: 3.9 mmol/L (ref 3.5–5.1)
SODIUM: 142 mmol/L (ref 135–145)
Total Bilirubin: 1 mg/dL (ref 0.3–1.2)
Total Protein: 6.6 g/dL (ref 6.5–8.1)

## 2018-05-19 LAB — I-STAT TROPONIN, ED: TROPONIN I, POC: 0 ng/mL (ref 0.00–0.08)

## 2018-05-19 NOTE — ED Notes (Signed)
Patient verbalizes understanding of discharge instructions. Opportunity for questioning and answers were provided. Armband removed by staff, pt discharged from ED.  

## 2018-05-19 NOTE — Discharge Instructions (Addendum)
It was my pleasure taking care of you today!   I have spoken with the cardiologist. They would like you to call the office first thing Monday morning to schedule an appointment. They recommended placing an event monitor so they could monitor your heart for a longer period of time.   Return to ER immediately for new or worsening symptoms, any additional concerns.

## 2018-05-19 NOTE — ED Notes (Signed)
Patient transported to X-ray 

## 2018-05-19 NOTE — ED Provider Notes (Signed)
[facility] EMERGENCY DEPARTMENT Provider Note   CSN: [id number] Arrival date & time: 05/19/18  1516     History   Chief Complaint Chief Complaint  Patient presents with  . Bradycardia    HPI Nickson B Rokosz is a 77 y.o. male.  The history is provided by the patient and medical records. No language interpreter was used.   Detric B Mccrackin is a 77 y.o. male  with a PMH of cardiomyopathy, HTN, HLD, pulmonary fibrosis on home o2 who presents to the Emergency Department from PCP office for concerns of bradycardia. Patient states that he went to the doctor today for a routine visit. He did feel some chest discomfort this morning which has now resolved. When in the office, his heart rate was in the 30's. He tried to stand up and got very lightheaded. He felt as if he was going to faint, but did not. Per EMS, he was in 2nd degree heart block. A few minutes later, he reportedly converted to NSR. Upon ER arrival, his heart rate was in the 90's. He currently has no complaints. No shortness of breath, fever, chills, abdominal pain, back pain, n/v or diaphoresis today. No new medications.   Past Medical History:  Diagnosis Date  . Abnormal ECG   . Appendicitis    1957  . CHF (congestive heart failure) (HCC)   . HTN (hypertension)     Patient Active Problem List   Diagnosis Date Noted  . IPF (idiopathic pulmonary fibrosis) (HCC) 06/14/2014  . Cardiomyopathy (HCC) 01/10/2014  . Elevated triglycerides with high cholesterol 06/05/2012  . Chronic systolic heart failure (HCC) 08/17/2011  . Abnormal ECG 08/17/2011  . HTN (hypertension) 08/17/2011    Past Surgical History:  Procedure Laterality Date  . APPENDECTOMY          Home Medications    Prior to Admission medications   Medication Sig Start Date End Date Taking? Authorizing Provider  aspirin 81 MG tablet Take 81 mg by mouth daily.    [provider]  losartan (COZAAR) 100 MG tablet TAKE 1 TABLET BY  MOUTH DAILY 08/27/14   Wendall Stade, MD  Multiple Vitamin (MULTIVITAMIN) capsule Take 1 capsule by mouth daily.    [provider]  omeprazole (PRILOSEC) 40 MG capsule Take 1 capsule by mouth daily as needed (for heartburn).  10/06/12   [provider]  psyllium (METAMUCIL) 58.6 % packet Take 1 packet by mouth daily as needed (for constipation).     [provider]  VENTOLIN HFA 108 (90 BASE) MCG/ACT inhaler Inhale 2 puffs into the lungs every 6 (six) hours as needed for wheezing or shortness of breath.  09/10/14   [provider]  vitamin B-12 (CYANOCOBALAMIN) 500 MCG tablet Take 1,000 mcg by mouth daily.     [provider]    Family History Family History  Problem Relation Age of Onset  . Stroke Mother   . Kidney failure Sister     Social History Social History   Tobacco Use  . Smoking status: Former Smoker    Packs/day: 0.70    Years: 5.00    Pack years: 3.50    Types: Cigarettes  . Smokeless tobacco: Current User    Types: Chew  Substance Use Topics  . Alcohol use: No    Alcohol/week: 0.0 standard drinks  . Drug use: No     Allergies   Ace inhibitors   Review of Systems Review of Systems  Cardiovascular:  Positive for chest pain.  All other systems reviewed and are negative.    Physical Exam Updated Vital Signs BP 128/73   Pulse 69   Temp 98.1 F (36.7 C) (Oral)   Resp (!) 24   Ht 5\' 10"  (1.778 m)   Wt 76.2 kg   SpO2 100%   BMI 24.11 kg/m   Physical Exam  Constitutional: He is oriented to person, place, and time. He appears well-developed and well-nourished. No distress.  HENT:  Head: Normocephalic and atraumatic.  Cardiovascular: Normal rate, regular rhythm and normal heart sounds.  No murmur heard. Pulmonary/Chest: Effort normal. No respiratory distress.  Abdominal: Soft. He exhibits no distension. There is no tenderness.  Musculoskeletal: He exhibits no edema.  Neurological: He is alert and oriented  to person, place, and time.  Skin: Skin is warm and dry.  Nursing note and vitals reviewed.    ED Treatments / Results  Labs (all labs ordered are listed, but only abnormal results are displayed) Labs Reviewed  CBC WITH DIFFERENTIAL/PLATELET - Abnormal; Notable for the following components:      Result Value   Lymphs Abs 0.4 (*)    All other components within normal limits  COMPREHENSIVE METABOLIC PANEL - Abnormal; Notable for the following components:   Glucose, Bld 100 (*)    BUN 7 (*)    Albumin 3.4 (*)    AST 46 (*)    All other components within normal limits  I-STAT TROPONIN, ED    EKG EKG Interpretation  Date/Time:  Friday May 19 2018 15:23:16 EDT Ventricular Rate:  98 PR Interval:    QRS Duration: 144 QT Interval:  376 QTC Calculation: 481 R Axis:   -54 Text Interpretation:  Sinus rhythm Probable left atrial enlargement Left bundle branch block Confirmed by Bethann Berkshire [phone]) on 05/19/2018 5:05:37 PM   Radiology Dg Chest 2 View  Result Date: 05/19/2018 CLINICAL DATA:  77 year old with chest pain. EXAM: CHEST - 2 VIEW COMPARISON:  10/01/2017 FINDINGS: Again noted are extensive calcifications throughout the mediastinum and bilateral hilar regions. Chronic interstitial lung densities compatible with fibrosis and architecture distortion. Heart size is within normal limits and stable. No large pleural effusions. No acute bone abnormality. IMPRESSION: Severe chronic lung changes compatible with fibrosis. No acute chest findings. Electronically Signed   By: Richarda Overlie M.D.   On: 05/19/2018 17:05    Procedures Procedures (including critical care time)  Medications Ordered in ED Medications - No data to display   Initial Impression / Assessment and Plan / ED Course  I have reviewed the triage vital signs and the nursing notes.  Pertinent labs & imaging results that were available during my care of the patient were reviewed by me and considered in my medical  decision making (see chart for details).    Benjerman B Shevlin is a 77 y.o. male who presents to ED for bradycardia noticed at PCP office today. Per EMS, patient had HR in the 30's with EKG showing 2nd degree AV block. He converted to NSR without any intervention. No syncope. EKG in ED today with LBBB and NSR. Has had previous EKG with LBBB in the past. Trop negative. Labs reassuring. Observed in ED for 3 hours on cardiac monitoring with no bradycardia or arrhythmias. Discussed case with cardiology, Dr. Anne Fu, who recommends patient calling clinic first thing Monday morning to schedule a follow up appointment for long term event monitor. Evaluation does not show pathology that would require ongoing emergent intervention or inpatient  treatment. Follow up information and return precautions were discussed. All questions answered.   Patient discussed with Dr. Estell Harpin who agrees with treatment plan.    Final Clinical Impressions(s) / ED Diagnoses   Final diagnoses:  Chest pain    ED Discharge Orders    None       Ward, Chase Picket, PA-C 05/19/18 Rhea Bleacher, MD 05/20/18 2110

## 2018-05-19 NOTE — ED Triage Notes (Signed)
Per EMS, patient was at dr's office where they were called out d/t bradycardia of 34-36bpm and in 2nd degree block. Patient then converted back to NSR with HR 98-100bpm with occasional PVCs. No c/o CP. Patient did arrive SOB, hx of pulmonary fibrosis and he wears 3L of O2 at home. Patient in NAD and stable upon arrival.

## 2018-05-22 ENCOUNTER — Telehealth: Payer: Self-pay

## 2018-05-22 ENCOUNTER — Emergency Department (HOSPITAL_COMMUNITY): Payer: Medicare Other

## 2018-05-22 ENCOUNTER — Encounter: Payer: Self-pay | Admitting: Cardiology

## 2018-05-22 ENCOUNTER — Inpatient Hospital Stay (HOSPITAL_COMMUNITY)
Admission: EM | Admit: 2018-05-22 | Discharge: 2018-05-28 | DRG: 242 | Disposition: A | Discharge status: Home / Self Care | Payer: Medicare Other | Attending: Cardiovascular Disease | Admitting: Cardiovascular Disease

## 2018-05-22 ENCOUNTER — Ambulatory Visit (INDEPENDENT_AMBULATORY_CARE_PROVIDER_SITE_OTHER): Payer: Medicare Other | Admitting: Cardiology

## 2018-05-22 ENCOUNTER — Other Ambulatory Visit: Payer: Self-pay

## 2018-05-22 ENCOUNTER — Encounter (HOSPITAL_COMMUNITY): Payer: Self-pay | Admitting: Emergency Medicine

## 2018-05-22 VITALS — BP 130/70 | HR 80 | Ht 69.0 in | Wt 135.6 lb

## 2018-05-22 DIAGNOSIS — I314 Cardiac tamponade: Secondary | ICD-10-CM | POA: Diagnosis not present

## 2018-05-22 DIAGNOSIS — R001 Bradycardia, unspecified: Secondary | ICD-10-CM

## 2018-05-22 DIAGNOSIS — R0602 Shortness of breath: Secondary | ICD-10-CM | POA: Diagnosis not present

## 2018-05-22 DIAGNOSIS — R42 Dizziness and giddiness: Secondary | ICD-10-CM | POA: Diagnosis present

## 2018-05-22 DIAGNOSIS — I452 Bifascicular block: Secondary | ICD-10-CM | POA: Diagnosis not present

## 2018-05-22 DIAGNOSIS — J84112 Idiopathic pulmonary fibrosis: Secondary | ICD-10-CM | POA: Diagnosis present

## 2018-05-22 DIAGNOSIS — Z66 Do not resuscitate: Secondary | ICD-10-CM | POA: Diagnosis not present

## 2018-05-22 DIAGNOSIS — I480 Paroxysmal atrial fibrillation: Secondary | ICD-10-CM | POA: Diagnosis not present

## 2018-05-22 DIAGNOSIS — R000 Tachycardia, unspecified: Secondary | ICD-10-CM | POA: Diagnosis not present

## 2018-05-22 DIAGNOSIS — Z7982 Long term (current) use of aspirin: Secondary | ICD-10-CM

## 2018-05-22 DIAGNOSIS — T829XXA Unspecified complication of cardiac and vascular prosthetic device, implant and graft, initial encounter: Secondary | ICD-10-CM

## 2018-05-22 DIAGNOSIS — I420 Dilated cardiomyopathy: Secondary | ICD-10-CM

## 2018-05-22 DIAGNOSIS — I442 Atrioventricular block, complete: Secondary | ICD-10-CM

## 2018-05-22 DIAGNOSIS — Z8701 Personal history of pneumonia (recurrent): Secondary | ICD-10-CM | POA: Diagnosis not present

## 2018-05-22 DIAGNOSIS — I459 Conduction disorder, unspecified: Secondary | ICD-10-CM

## 2018-05-22 DIAGNOSIS — I509 Heart failure, unspecified: Secondary | ICD-10-CM | POA: Diagnosis present

## 2018-05-22 DIAGNOSIS — I313 Pericardial effusion (noninflammatory): Secondary | ICD-10-CM | POA: Diagnosis not present

## 2018-05-22 DIAGNOSIS — Z87891 Personal history of nicotine dependence: Secondary | ICD-10-CM

## 2018-05-22 DIAGNOSIS — I447 Left bundle-branch block, unspecified: Secondary | ICD-10-CM | POA: Diagnosis not present

## 2018-05-22 DIAGNOSIS — I3139 Other pericardial effusion (noninflammatory): Secondary | ICD-10-CM

## 2018-05-22 DIAGNOSIS — I499 Cardiac arrhythmia, unspecified: Secondary | ICD-10-CM | POA: Diagnosis not present

## 2018-05-22 DIAGNOSIS — R410 Disorientation, unspecified: Secondary | ICD-10-CM | POA: Diagnosis not present

## 2018-05-22 DIAGNOSIS — R578 Other shock: Secondary | ICD-10-CM | POA: Diagnosis not present

## 2018-05-22 DIAGNOSIS — I361 Nonrheumatic tricuspid (valve) insufficiency: Secondary | ICD-10-CM | POA: Diagnosis not present

## 2018-05-22 DIAGNOSIS — I959 Hypotension, unspecified: Secondary | ICD-10-CM | POA: Diagnosis not present

## 2018-05-22 DIAGNOSIS — Z681 Body mass index (BMI) 19 or less, adult: Secondary | ICD-10-CM | POA: Diagnosis not present

## 2018-05-22 DIAGNOSIS — R451 Restlessness and agitation: Secondary | ICD-10-CM | POA: Diagnosis not present

## 2018-05-22 DIAGNOSIS — Z8679 Personal history of other diseases of the circulatory system: Secondary | ICD-10-CM | POA: Diagnosis not present

## 2018-05-22 DIAGNOSIS — J9621 Acute and chronic respiratory failure with hypoxia: Secondary | ICD-10-CM | POA: Diagnosis not present

## 2018-05-22 DIAGNOSIS — J849 Interstitial pulmonary disease, unspecified: Secondary | ICD-10-CM

## 2018-05-22 DIAGNOSIS — R413 Other amnesia: Secondary | ICD-10-CM | POA: Diagnosis present

## 2018-05-22 DIAGNOSIS — Z888 Allergy status to other drugs, medicaments and biological substances status: Secondary | ICD-10-CM

## 2018-05-22 DIAGNOSIS — J8410 Pulmonary fibrosis, unspecified: Secondary | ICD-10-CM | POA: Diagnosis not present

## 2018-05-22 DIAGNOSIS — R569 Unspecified convulsions: Secondary | ICD-10-CM | POA: Diagnosis not present

## 2018-05-22 DIAGNOSIS — I10 Essential (primary) hypertension: Secondary | ICD-10-CM | POA: Diagnosis present

## 2018-05-22 DIAGNOSIS — Z79899 Other long term (current) drug therapy: Secondary | ICD-10-CM

## 2018-05-22 DIAGNOSIS — Z950 Presence of cardiac pacemaker: Secondary | ICD-10-CM | POA: Diagnosis not present

## 2018-05-22 DIAGNOSIS — Z9981 Dependence on supplemental oxygen: Secondary | ICD-10-CM

## 2018-05-22 DIAGNOSIS — R55 Syncope and collapse: Secondary | ICD-10-CM | POA: Diagnosis present

## 2018-05-22 DIAGNOSIS — I110 Hypertensive heart disease with heart failure: Secondary | ICD-10-CM | POA: Diagnosis present

## 2018-05-22 DIAGNOSIS — R0989 Other specified symptoms and signs involving the circulatory and respiratory systems: Secondary | ICD-10-CM | POA: Diagnosis not present

## 2018-05-22 DIAGNOSIS — F0390 Unspecified dementia without behavioral disturbance: Secondary | ICD-10-CM | POA: Diagnosis present

## 2018-05-22 DIAGNOSIS — Z8639 Personal history of other endocrine, nutritional and metabolic disease: Secondary | ICD-10-CM | POA: Diagnosis not present

## 2018-05-22 HISTORY — DX: Left bundle-branch block, unspecified: I44.7

## 2018-05-22 HISTORY — DX: Pericardial effusion (noninflammatory): I31.3

## 2018-05-22 HISTORY — DX: Unspecified convulsions: R56.9

## 2018-05-22 HISTORY — DX: Dilated cardiomyopathy: I42.0

## 2018-05-22 HISTORY — DX: Other specified postprocedural states: Z98.890

## 2018-05-22 HISTORY — DX: Idiopathic pulmonary fibrosis: J84.112

## 2018-05-22 HISTORY — DX: Atrioventricular block, complete: I44.2

## 2018-05-22 HISTORY — DX: Paroxysmal atrial fibrillation: I48.0

## 2018-05-22 HISTORY — DX: Other pericardial effusion (noninflammatory): I31.39

## 2018-05-22 LAB — HEPATIC FUNCTION PANEL
ALBUMIN: 4.2 g/dL (ref 3.5–5.0)
ALT: 19 U/L (ref 0–44)
AST: 32 U/L (ref 15–41)
Alkaline Phosphatase: 104 U/L (ref 38–126)
BILIRUBIN TOTAL: 0.8 mg/dL (ref 0.3–1.2)
Bilirubin, Direct: 0.2 mg/dL (ref 0.0–0.2)
Indirect Bilirubin: 0.6 mg/dL (ref 0.3–0.9)
TOTAL PROTEIN: 8.2 g/dL — AB (ref 6.5–8.1)

## 2018-05-22 LAB — CREATININE, SERUM: Creatinine, Ser: 0.74 mg/dL (ref 0.61–1.24)

## 2018-05-22 LAB — CBC
HCT: 47 % (ref 39.0–52.0)
HEMATOCRIT: 49.7 % (ref 39.0–52.0)
HEMOGLOBIN: 15.6 g/dL (ref 13.0–17.0)
Hemoglobin: 14.9 g/dL (ref 13.0–17.0)
MCH: 29.8 pg (ref 26.0–34.0)
MCH: 29.4 pg (ref 26.0–34.0)
MCHC: 31.7 g/dL (ref 30.0–36.0)
MCHC: 31.4 g/dL (ref 30.0–36.0)
MCV: 94 fL (ref 80.0–100.0)
MCV: 93.6 fL (ref 80.0–100.0)
NRBC: 0 % (ref 0.0–0.2)
NRBC: 0 % (ref 0.0–0.2)
PLATELETS: 264 10*3/uL (ref 150–400)
Platelets: 234 10*3/uL (ref 150–400)
RBC: 5 MIL/uL (ref 4.22–5.81)
RBC: 5.31 MIL/uL (ref 4.22–5.81)
RDW: 12.5 % (ref 11.5–15.5)
RDW: 12.4 % (ref 11.5–15.5)
WBC: 7.1 10*3/uL (ref 4.0–10.5)
WBC: 6.5 10*3/uL (ref 4.0–10.5)

## 2018-05-22 LAB — BASIC METABOLIC PANEL
Anion gap: 10 (ref 5–15)
BUN: 9 mg/dL (ref 8–23)
CALCIUM: 9.6 mg/dL (ref 8.9–10.3)
CO2: 29 mmol/L (ref 22–32)
CREATININE: 0.72 mg/dL (ref 0.61–1.24)
Chloride: 101 mmol/L (ref 98–111)
GFR calc non Af Amer: >60 mL/min (ref >60)
GLUCOSE: 95 mg/dL (ref 70–99)
Potassium: 4.2 mmol/L (ref 3.5–5.1)
Sodium: 140 mmol/L (ref 135–145)

## 2018-05-22 LAB — HEMOGLOBIN A1C
Hgb A1c MFr Bld: 4.5 % — ABNORMAL LOW (ref 4.8–5.6)
Mean Plasma Glucose: 82.45 mg/dL

## 2018-05-22 LAB — T4, FREE: Free T4: 0.83 ng/dL (ref 0.82–1.77)

## 2018-05-22 LAB — TSH: TSH: 6.304 u[IU]/mL — AB (ref 0.350–4.500)

## 2018-05-22 LAB — PROTIME-INR
INR: 1.04
PROTHROMBIN TIME: 13.5 s (ref 11.4–15.2)

## 2018-05-22 LAB — TROPONIN I: Troponin I: <0.03 ng/mL (ref <0.03)

## 2018-05-22 LAB — I-STAT TROPONIN, ED: Troponin i, poc: 0 ng/mL (ref 0.00–0.08)

## 2018-05-22 LAB — MAGNESIUM: Magnesium: 2.3 mg/dL (ref 1.7–2.4)

## 2018-05-22 MED ORDER — ALBUTEROL SULFATE (2.5 MG/3ML) 0.083% IN NEBU: 2.5000 mg | INHALATION_SOLUTION | Freq: Four times a day (QID) | RESPIRATORY_TRACT | Status: DC | PRN

## 2018-05-22 MED ORDER — MIRTAZAPINE 15 MG PO TABS
15.0000 mg | ORAL_TABLET | Freq: Every day | ORAL | Status: DC
  Administered 2018-05-22 – 2018-05-27 (×6): 15 mg via ORAL
  Filled 2018-05-22 (×3): qty 1
  Filled 2018-05-22: qty 2
  Filled 2018-05-22 (×2): qty 1

## 2018-05-22 MED ORDER — NITROGLYCERIN 0.4 MG SL SUBL: 0.4000 mg | SUBLINGUAL_TABLET | SUBLINGUAL | Status: DC | PRN

## 2018-05-22 MED ORDER — ALBUTEROL SULFATE HFA 108 (90 BASE) MCG/ACT IN AERS
2.0000 | INHALATION_SPRAY | Freq: Four times a day (QID) | RESPIRATORY_TRACT | Status: DC | PRN
  Filled 2018-05-22: qty 6.7

## 2018-05-22 MED ORDER — ATORVASTATIN CALCIUM 20 MG PO TABS
20.0000 mg | ORAL_TABLET | Freq: Every day | ORAL | Status: DC
  Administered 2018-05-22 – 2018-05-27 (×5): 20 mg via ORAL
  Filled 2018-05-22 (×5): qty 1

## 2018-05-22 MED ORDER — ADULT MULTIVITAMIN W/MINERALS CH
1.0000 | ORAL_TABLET | Freq: Every day | ORAL | Status: DC
  Administered 2018-05-22 – 2018-05-28 (×6): 1 via ORAL
  Filled 2018-05-22 (×7): qty 1

## 2018-05-22 MED ORDER — SODIUM CHLORIDE 0.9 % IV SOLN
INTRAVENOUS | Status: DC
  Administered 2018-05-22 – 2018-05-26 (×2): via INTRAVENOUS

## 2018-05-22 MED ORDER — ACETAMINOPHEN 325 MG PO TABS: 650.0000 mg | ORAL_TABLET | ORAL | Status: DC | PRN

## 2018-05-22 MED ORDER — FLUTICASONE FUROATE-VILANTEROL 200-25 MCG/INH IN AEPB
1.0000 | INHALATION_SPRAY | Freq: Every day | RESPIRATORY_TRACT | Status: DC
  Administered 2018-05-23 – 2018-05-26 (×4): 1 via RESPIRATORY_TRACT
  Filled 2018-05-22 (×2): qty 28

## 2018-05-22 MED ORDER — ASPIRIN 300 MG RE SUPP: 300.0000 mg | RECTAL | Status: AC

## 2018-05-22 MED ORDER — VITAMIN B-12 1000 MCG PO TABS
1000.0000 ug | ORAL_TABLET | Freq: Every day | ORAL | Status: DC
  Administered 2018-05-22 – 2018-05-28 (×6): 1000 ug via ORAL
  Filled 2018-05-22 (×7): qty 1

## 2018-05-22 MED ORDER — PANTOPRAZOLE SODIUM 40 MG PO TBEC
40.0000 mg | DELAYED_RELEASE_TABLET | Freq: Every day | ORAL | Status: DC
  Administered 2018-05-22 – 2018-05-23 (×2): 40 mg via ORAL
  Filled 2018-05-22 (×2): qty 1

## 2018-05-22 MED ORDER — ONDANSETRON HCL 4 MG/2ML IJ SOLN
4.0000 mg | Freq: Four times a day (QID) | INTRAMUSCULAR | Status: DC | PRN
  Administered 2018-05-23: 4 mg via INTRAVENOUS
  Filled 2018-05-22: qty 2

## 2018-05-22 MED ORDER — ASPIRIN EC 81 MG PO TBEC
81.0000 mg | DELAYED_RELEASE_TABLET | Freq: Every day | ORAL | Status: DC
  Administered 2018-05-23 – 2018-05-28 (×6): 81 mg via ORAL
  Filled 2018-05-22 (×6): qty 1

## 2018-05-22 MED ORDER — LORATADINE 10 MG PO TABS
10.0000 mg | ORAL_TABLET | Freq: Every day | ORAL | Status: DC
  Administered 2018-05-22 – 2018-05-28 (×7): 10 mg via ORAL
  Filled 2018-05-22 (×7): qty 1

## 2018-05-22 MED ORDER — HEPARIN SODIUM (PORCINE) 5000 UNIT/ML IJ SOLN
5000.0000 [IU] | Freq: Three times a day (TID) | INTRAMUSCULAR | Status: DC
  Administered 2018-05-22 – 2018-05-24 (×3): 5000 [IU] via SUBCUTANEOUS
  Filled 2018-05-22 (×3): qty 1

## 2018-05-22 MED ORDER — PSYLLIUM 95 % PO PACK
1.0000 | PACK | Freq: Every day | ORAL | Status: DC | PRN
  Administered 2018-05-27: 1 via ORAL
  Filled 2018-05-22 (×3): qty 1

## 2018-05-22 MED ORDER — ASPIRIN 81 MG PO CHEW
324.0000 mg | CHEWABLE_TABLET | ORAL | Status: AC
  Administered 2018-05-22: 324 mg via ORAL
  Filled 2018-05-22: qty 4

## 2018-05-22 NOTE — ED Notes (Signed)
Heart healthy dinner tray ordered 

## 2018-05-22 NOTE — Telephone Encounter (Signed)
Per review Pt has appt with Dr. Charyl Bigger today in [place].  No work up needed.

## 2018-05-22 NOTE — ED Provider Notes (Signed)
[facility] EMERGENCY DEPARTMENT Provider Note   CSN: [id number] Arrival date & time: 05/22/18  1643     History   Chief Complaint Chief Complaint  Patient presents with  . Abnormal ECG    HPI Dale Winters is a 77 y.o. male with past medical history of idiopathic pulmonary fibrosis on 4L at baseline;, hypertension and new heart block who presents emergency department today from cardiology office for abnormal EKG.  Patient last Friday went to primary care office where he had a syncopal episode and EKG is interpreted as heart block.  He was seen in the emergency department and instructed to follow-up with cardiology.  He has had recurring episodes of near syncope upon standing but denies syncope since event in PCPs office.  Patient was seen at cardiology office, Dr. Bing Matter, today and recommended for pacemaker placement.  He denies any current symptoms.  No headache, visual changes, vertigo, facial droop, difficulty with speech, chest pain, shortness of breath, nausea vomiting/diarrhea, abdominal pain, focal weakness.   HPI  Past Medical History:  Diagnosis Date  . Abnormal ECG   . Appendicitis    1957  . CHF (congestive heart failure) (HCC)   . HTN (hypertension)     Patient Active Problem List   Diagnosis Date Noted  . Transient complete heart block (HCC) 05/22/2018  . Left bundle branch block 05/22/2018  . IPF (idiopathic pulmonary fibrosis) (HCC) 06/14/2014  . Cardiomyopathy (HCC) 01/10/2014  . Elevated triglycerides with high cholesterol 06/05/2012  . Chronic systolic heart failure (HCC) 08/17/2011  . Abnormal ECG 08/17/2011  . HTN (hypertension) 08/17/2011    Past Surgical History:  Procedure Laterality Date  . APPENDECTOMY          Home Medications    Prior to Admission medications   Medication Sig Start Date End Date Taking? Authorizing Provider  aspirin 81 MG tablet Take 81 mg by mouth daily.    [provider]  BREO  ELLIPTA 200-25 MCG/INH AEPB Inhale 1 puff into the lungs daily. 05/08/18   [provider]  cetirizine (ZYRTEC) 10 MG tablet Take 10 mg by mouth daily.    [provider]  mirtazapine (REMERON) 15 MG tablet Take 1 tablet by mouth at bedtime.    [provider]  Multiple Vitamin (MULTIVITAMIN) capsule Take 1 capsule by mouth daily.    [provider]  omeprazole (PRILOSEC) 40 MG capsule Take 1 capsule by mouth daily as needed (for heartburn).  10/06/12   [provider]  psyllium (METAMUCIL) 58.6 % packet Take 1 packet by mouth daily as needed (for constipation).     [provider]  VENTOLIN HFA 108 (90 BASE) MCG/ACT inhaler Inhale 2 puffs into the lungs every 6 (six) hours as needed for wheezing or shortness of breath.  09/10/14   [provider]  vitamin B-12 (CYANOCOBALAMIN) 500 MCG tablet Take 1,000 mcg by mouth daily.     [provider]    Family History Family History  Problem Relation Age of Onset  . Stroke Mother   . Kidney failure Sister     Social History Social History   Tobacco Use  . Smoking status: Former Smoker    Packs/day: 0.70    Years: 5.00    Pack years: 3.50    Types: Cigarettes  . Smokeless tobacco: Current User    Types: Chew  Substance Use Topics  . Alcohol use: No    Alcohol/week: 0.0 standard drinks  .  Drug use: No     Allergies   Ace inhibitors   Review of Systems Review of Systems  All other systems reviewed and are negative.    Physical Exam Updated Vital Signs BP 106/86   Pulse 87   Temp 97.6 F (36.4 C) (Oral)   Resp (!) 21   Ht 5\' 10"  (1.778 m)   Wt 62.6 kg   SpO2 99%   BMI 19.80 kg/m   Physical Exam  Constitutional: He appears well-developed and well-nourished.  Frail, elderly male in NAD  HENT:  Head: Normocephalic and atraumatic.  Right Ear: External ear normal.  Left Ear: External ear normal.  Nose: Nose normal.  Mouth/Throat: Uvula is midline,  oropharynx is clear and moist and mucous membranes are normal. No tonsillar exudate.  Eyes: Pupils are equal, round, and reactive to light. Right eye exhibits no discharge. Left eye exhibits no discharge. No scleral icterus.  Neck: Trachea normal. Neck supple. No spinous process tenderness present. No neck rigidity. Normal range of motion present.  Cardiovascular: Normal rate, regular rhythm and intact distal pulses.  No murmur heard. Pulses:      Radial pulses are 2+ on the right side, and 2+ on the left side.       Dorsalis pedis pulses are 2+ on the right side, and 2+ on the left side.       Posterior tibial pulses are 2+ on the right side, and 2+ on the left side.  No lower extremity swelling or edema. Calves symmetric in size bilaterally.  Pulmonary/Chest: Effort normal and breath sounds normal. He exhibits no tenderness.  Abdominal: Soft. Bowel sounds are normal. There is no tenderness. There is no rebound and no guarding.  Musculoskeletal: He exhibits no edema.  Lymphadenopathy:    He has no cervical adenopathy.  Neurological: He is alert.  Speech clear. Follows commands. No facial droop. PERRLA. EOMI. Normal peripheral fields. CN III-XII intact.  Grossly moves all extremities 4 without ataxia. Coordination intact. Able and appropriate strength for age to upper and lower extremities bilaterally including grip strength & plantar flexion/dorsiflexion. Sensation to light touch intact bilaterally for upper and lower. Patellar deep tendon reflex 2+ and equal bilaterally. Normal finger to nose. No pronator drift.   Skin: Skin is warm and dry. No rash noted. He is not diaphoretic.  Psychiatric: He has a normal mood and affect.  Nursing note and vitals reviewed.    ED Treatments / Results  Labs (all labs ordered are listed, but only abnormal results are displayed) Labs Reviewed  HEPATIC FUNCTION PANEL - Abnormal; Notable for the following components:      Result Value   Total Protein  8.2 (*)    All other components within normal limits  TSH - Abnormal; Notable for the following components:   TSH 6.304 (*)    All other components within normal limits  HEMOGLOBIN A1C - Abnormal; Notable for the following components:   Hgb A1c MFr Bld 4.5 (*)    All other components within normal limits  BASIC METABOLIC PANEL  CBC  CBC  CREATININE, SERUM  MAGNESIUM  T4, FREE  TROPONIN I  PROTIME-INR  TROPONIN I  TROPONIN I  LIPID PANEL  BASIC METABOLIC PANEL  CBC  I-STAT TROPONIN, ED    EKG EKG Interpretation  Date/Time:  Monday May 22 2018 16:47:40 EDT Ventricular Rate:  84 PR Interval:    QRS Duration: 152 QT Interval:  411 QTC Calculation: 486 R Axis:   -57  Text Interpretation:  Sinus rhythm Left bundle branch block Baseline wander in lead(s) I II aVR aVL aVF V1 V2 V3 V4 V5 No significant change since last tracing Confirmed by Vanetta Mulders [phone]) on 05/22/2018 5:16:58 PM   Radiology Dg Chest 2 View  Result Date: 05/22/2018 CLINICAL DATA:  Left bundle-branch heart block. EXAM: CHEST - 2 VIEW COMPARISON:  CT 07/14/2017, CXR 05/19/2018 FINDINGS: Cardiomegaly with mediastinal and hilar calcifications similar to prior with moderate degree of diffuse interstitial fibrosis. No overt pulmonary edema, effusion or pneumothorax. Nonaneurysmal appearing thoracic aorta. Biapical pleuroparenchymal thickening is identified. No acute osseous abnormality. IMPRESSION: Chronic interstitial lung disease with fibrosis. Mediastinal and hilar calcifications compatible with old granulomatous disease or possibly pneumoconiosis. Stable cardiomegaly. Electronically Signed   By: Tollie Eth M.D.   On: 05/22/2018 17:39    Procedures Procedures (including critical care time)  Medications Ordered in ED Medications  aspirin EC tablet 81 mg (has no administration in time range)  nitroGLYCERIN (NITROSTAT) SL tablet 0.4 mg (has no administration in time range)  acetaminophen (TYLENOL)  tablet 650 mg (has no administration in time range)  ondansetron (ZOFRAN) injection 4 mg (has no administration in time range)  heparin injection 5,000 Units (has no administration in time range)  0.9 %  sodium chloride infusion ( Intravenous New Bag/Given 05/22/18 2123)  atorvastatin (LIPITOR) tablet 20 mg (20 mg Oral Given 05/22/18 2120)  mirtazapine (REMERON) tablet 15 mg (has no administration in time range)  pantoprazole (PROTONIX) EC tablet 40 mg (40 mg Oral Given 05/22/18 2120)  psyllium (HYDROCIL/METAMUCIL) packet 1 packet (has no administration in time range)  vitamin B-12 (CYANOCOBALAMIN) tablet 1,000 mcg (has no administration in time range)  multivitamin with minerals tablet 1 tablet (has no administration in time range)  fluticasone furoate-vilanterol (BREO ELLIPTA) 200-25 MCG/INH 1 puff (has no administration in time range)  loratadine (CLARITIN) tablet 10 mg (10 mg Oral Given 05/22/18 2120)  albuterol (PROVENTIL HFA;VENTOLIN HFA) 108 (90 Base) MCG/ACT inhaler 2 puff (has no administration in time range)  aspirin chewable tablet 324 mg (324 mg Oral Given 05/22/18 2120)    Or  aspirin suppository 300 mg ( Rectal See Alternative 05/22/18 2120)     Initial Impression / Assessment and Plan / ED Course  I have reviewed the triage vital signs and the nursing notes.  Pertinent labs & imaging results that were available during my care of the patient were reviewed by me and considered in my medical decision making (see chart for details).     77 y.o. male with past medical history of idiopathic pulmonary fibrosis on 4L at baseline, hypertension and heart block who presents emergency department today from cardiology office for admission for pacemaker placement. Patient was seen by Dr. Bing Matter who recommended patient come to the ED to be admitted for pacemaker placement. Patient last Friday went to primary care office where he had a syncopal episode and EKG is interpreted as heart  block.  He was seen in the emergency department with baseline ekg, cardiology was consulted and he was instructed to follow-up with cardiology.  He has had recurring episodes of near syncope upon standing but denies syncope since event in PCPs office.   He denies any current symptoms.  No headache, visual changes, vertigo, facial droop, difficulty with speech, chest pain, shortness of breath, nausea vomiting/diarrhea, abdominal pain, focal weakness. Vital signs are reassuring. Baseline EKG as above. Labs reassuring. Imaging reviewed, chronic changes. No focal deficits on exam.  5:53 PM Consult  to cardiology.   6:07 PM Dr. Jacques Navy of cardiology will admit the patinet. Patient can have a light meal. They will see the patient. Family updated and patient in agreement with plan. Patient case discussed with Dr. Deretha Emory who is in agreement with plan.  Final Clinical Impressions(s) / ED Diagnoses   Final diagnoses:  Heart block    ED Discharge Orders    None       Princella Pellegrini 05/22/18 2218    Vanetta Mulders, MD 05/22/18 [phone]

## 2018-05-22 NOTE — Patient Instructions (Signed)
Medication Instructions:  Your physician recommends that you continue on your current medications as directed. Please refer to the Current Medication list given to you today.  If you need a refill on your cardiac medications before your next appointment, please call your pharmacy.   Lab work: None.  If you have labs (blood work) drawn today and your tests are completely normal, you will receive your results only by: . MyChart Message (if you have MyChart) OR . A paper copy in the mail If you have any lab test that is abnormal or we need to change your treatment, we will call you to review the results.  Testing/Procedures: None.   Follow-Up: At [facility], you and your health needs are our priority.  As part of our continuing mission to provide you with exceptional heart care, we have created designated Provider Care Teams.  These Care Teams include your primary Cardiologist (physician) and Advanced Practice Providers (APPs -  Physician Assistants and Nurse Practitioners) who all work together to provide you with the care you need, when you need it. You will need a follow up appointment in 1 months.  Please call our office 2 months in advance to schedule this appointment.  You may see No primary care provider on file. or another member of our [facility] Provider Team in [place]: Brian Munley, MD . Rajan Revankar, MD  Any Other Special Instructions Will Be Listed Below (If Applicable).     

## 2018-05-22 NOTE — ED Triage Notes (Signed)
Per [facility], pt was seen at PCP on Friday which showed complete heart block on EKG, pt sent to [facility]/admitted for obs and discharged and referred to cards. Pt at cardiologist today and their EKG showed LBB which EMS states he has hx of. Per EMS, pt sent here for pacemaker. Pt has no complaints. EMS states for several weeks pt has gotten lightheaded when standing. Pt always on 3L [place] r/t pulmonary fibrosis.

## 2018-05-22 NOTE — Progress Notes (Signed)
Patient transported to [facility] via [facility].

## 2018-05-22 NOTE — ED Provider Notes (Signed)
Medical screening examination/treatment/procedure(s) were conducted as a shared visit with non-physician practitioner(s) and myself.  I personally evaluated the patient during the encounter.  EKG Interpretation  Date/Time:  Monday May 22 2018 16:47:40 EDT Ventricular Rate:  84 PR Interval:    QRS Duration: 152 QT Interval:  411 QTC Calculation: 486 R Axis:   -57 Text Interpretation:  Sinus rhythm Left bundle branch block Baseline wander in lead(s) I II aVR aVL aVF V1 V2 V3 V4 V5 No significant change since last tracing Confirmed by Vanetta Mulders [phone]) on 05/22/2018 5:16:58 PM   Patient seen by me along with physician assistant.  Patient referred in by cardiology for historical concerns for complete heart block.  EKG here today shows his normal left bundle branch block.  Seen by cardiology just earlier today.  EKG showed the same there.  Patient reports that his history of frequent near syncopal episodes.  They referred him in for admission to receive a pacemaker.     Vanetta Mulders, MD 05/22/18 1739

## 2018-05-22 NOTE — Progress Notes (Signed)
Cardiology Consultation:    Date:  05/22/2018   ID:  Dale Winters, DOB Sep 21, 1940, MRN [id number]  PCP:  Lise Auer, MD  Cardiologist:  Gypsy Balsam, MD   Referring MD: Lise Auer, MD   Chief Complaint  Patient presents with  . Hospitalization Follow-up  I was recently in the hospital  History of Present Illness:    Dale Winters is a 77 y.o. male who is being seen today for the evaluation of syncope at the request of Lise Auer, MD.  Apparently last Friday he went to his primary care physician for follow-up on his chronic problems which include idiopathic pulmonary fibrosis essential hypertension.  While in the office he passed out.  He had EKG done EKG was interpreted as a heart block ambulance was called and he was transferred to the emergency room.  He was evaluated there and he was advised to follow-up with me with instruction to have long-term heart rate monitor.  At the time he presented to the emergency room he is conducting one-to-one with baseline left bundle branch block.  He describes numerous episodes of near syncope never completely passed out but many times he had episode that he almost blacked out.  It happens many times when he gets up very quickly. His chronic problem includes idiopathic pulmonary fibrosis he is oxygen all the time. There is diagnosis of cardiomyopathy in the chart however apparently a year ago he got echocardiogram which showed preserved left ventricular ejection fraction. His ability to exercise is very limited because of shortness of breath.  Past Medical History:  Diagnosis Date  . Abnormal ECG   . Appendicitis    1956  . CHF (congestive heart failure) (HCC)   . HTN (hypertension)     Past Surgical History:  Procedure Laterality Date  . APPENDECTOMY      Current Medications: Current Meds  Medication Sig  . aspirin 81 MG tablet Take 81 mg by mouth daily.  Marland Kitchen BREO ELLIPTA 200-25 MCG/INH AEPB Inhale 1 puff into the lungs  daily.  . cetirizine (ZYRTEC) 10 MG tablet Take 10 mg by mouth daily.  . mirtazapine (REMERON) 15 MG tablet Take 1 tablet by mouth at bedtime.  . Multiple Vitamin (MULTIVITAMIN) capsule Take 1 capsule by mouth daily.  Marland Kitchen omeprazole (PRILOSEC) 40 MG capsule Take 1 capsule by mouth daily as needed (for heartburn).   . psyllium (METAMUCIL) 58.6 % packet Take 1 packet by mouth daily as needed (for constipation).   . VENTOLIN HFA 108 (90 BASE) MCG/ACT inhaler Inhale 2 puffs into the lungs every 6 (six) hours as needed for wheezing or shortness of breath.   . vitamin B-12 (CYANOCOBALAMIN) 500 MCG tablet Take 1,000 mcg by mouth daily.      Allergies:   Ace inhibitors   Social History   Socioeconomic History  . Marital status: Married    Spouse name: Not on file  . Number of children: Not on file  . Years of education: Not on file  . Highest education level: Not on file  Occupational History  . Not on file  Social Needs  . Financial resource strain: Not on file  . Food insecurity:    Worry: Not on file    Inability: Not on file  . Transportation needs:    Medical: Not on file    Non-medical: Not on file  Tobacco Use  . Smoking status: Former Smoker    Packs/day: 0.70  Years: 5.00    Pack years: 3.50    Types: Cigarettes  . Smokeless tobacco: Current User    Types: Chew  Substance and Sexual Activity  . Alcohol use: No    Alcohol/week: 0.0 standard drinks  . Drug use: No  . Sexual activity: Not on file  Lifestyle  . Physical activity:    Days per week: Not on file    Minutes per session: Not on file  . Stress: Not on file  Relationships  . Social connections:    Talks on phone: Not on file    Gets together: Not on file    Attends religious service: Not on file    Active member of club or organization: Not on file    Attends meetings of clubs or organizations: Not on file    Relationship status: Not on file  Other Topics Concern  . Not on file  Social History  Narrative  . Not on file     Family History: The patient's family history includes Kidney failure in his sister; Stroke in his mother. ROS:   Please see the history of present illness.    All 14 point review of systems negative except as described per history of present illness.  EKGs/Labs/Other Studies Reviewed:    The following studies were reviewed today: EKG done and primary care physician office which show sinus rhythm with complete heart block escape mechanism was ventricular in nature right bundle branch block morphology.  EKG:  EKG is  ordered today.  The ekg ordered today demonstrates normal sinus rhythm rate 91 left bundle branch block  Recent Labs: 05/19/2018: ALT 15; BUN 7; Creatinine, Ser 0.84; Hemoglobin 14.0; Platelets 187; Potassium 3.9; Sodium 142  Recent Lipid Panel No results found for: CHOL, TRIG, HDL, CHOLHDL, VLDL, LDLCALC, LDLDIRECT  Physical Exam:    VS:  BP 130/70   Pulse 80   Ht 5\' 9"  (1.753 m)   Wt 135 lb 9.6 oz (61.5 kg)   SpO2 98% Comment: 2.5 lt  BMI 20.02 kg/m     Wt Readings from Last 3 Encounters:  05/22/18 135 lb 9.6 oz (61.5 kg)  05/19/18 168 lb (76.2 kg)  06/05/15 158 lb 12.8 oz (72 kg)     GEN:  Well nourished, well developed in no acute distress HEENT: Normal NECK: No JVD; No carotid bruits LYMPHATICS: No lymphadenopathy CARDIAC: RRR, no murmurs, no rubs, no gallops RESPIRATORY:  Clear to auscultation without rales, wheezing or rhonchi  ABDOMEN: Soft, non-tender, non-distended MUSCULOSKELETAL:  No edema; No deformity  SKIN: Warm and dry NEUROLOGIC:  Alert and oriented x 3 PSYCHIATRIC:  Normal affect   ASSESSMENT:    1. IPF (idiopathic pulmonary fibrosis) (HCC)   2. Transient complete heart block (HCC)   3. Left bundle branch block   4. Essential hypertension   5. Dilated cardiomyopathy (HCC)    PLAN:    In order of problems listed above:  1. Complete heart block.  Patient will require to be paced.  I explained the  procedure to him as well as the family he is ready to proceed.  I do not identify any reversible causes for his complete heart block. 2. Baseline left bundle branch block.  Echocardiogram will need to be done to assess left ventricular ejection fraction 3. Essential hypertension well controlled. 4. History of dilated cardiomyopathy echocardiogram need to be repeated to check left ventricular ejection fraction.    Will be transferred to [facility] ER  Medication Adjustments/Labs and  Tests Ordered: Current medicines are reviewed at length with the patient today.  Concerns regarding medicines are outlined above.  No orders of the defined types were placed in this encounter.  No orders of the defined types were placed in this encounter.   Signed, Georgeanna Lea, MD, [facility]. 05/22/2018 3:39 PM    [facility]

## 2018-05-22 NOTE — Telephone Encounter (Signed)
-----   Message from Jake Bathe, MD sent at 05/19/2018  6:21 PM EDT ----- Regarding: Needs long term monitor Patient of Dr. Eden Emms - needs long term monitor and clinic follow up with APP or Nishan. ? Second degree HB. Has LBBB. Abnormal ECG. Sent to ER for ? Arrhythmia but here in ER had no issues. No syncope.   Thanks Donato Schultz, MD

## 2018-05-22 NOTE — H&P (Signed)
Cardiology Admission History and Physical:   Patient ID: Dale Winters MRN: [id number]; DOB: 08-27-1940   Admission date: 05/22/2018  Primary Care Provider: Lise Auer, MD Primary Cardiologist: Gypsy Balsam, MD  Primary Electrophysiologist:  None   Chief Complaint:  Syncope and heart block alternating RBBB to LBBB  Patient Profile:   Dale Winters is a 77 y.o. male with HTN, CHF, idiopathic pulmonary fibrosis, transient CHB with syncope, chronic LBBB and hx dilated CM.  History of Present Illness:   Dale Winters with above hx and was seen at his PCP on the 25th of Oct when he had syncope. EKG with CHB.  EMS transferred to [facility].  IN ER SR with LBBB, He was instructed to have 30 day event monitor and follow up as outpt.  Today he was seem by Dr. Bing Matter  And pt has had numerous episodes of near syncope, also happens when he stands quickly.  With these episodes Dr. Bing Matter sent him to [facility] for further eval and possible PPM.    Currently pt has no complaints, never with chest pain, his dyspnea is mostly with exertion.  He is on hospice for his pulmonary issues, but his wife tells me they will cancel if needed for treatment.    EKG SR LBBB HR 84 I personally reviewed.  Troponin 0.00, Hgb 14.9, plts 264, WBC 7.1  2 V cxr Chronic interstitial lung disease with fibrosis. Mediastinal and hilar calcifications compatible with old granulomatous disease or possibly pneumoconiosis. Stable cardiomegaly.  EKG from from 05/19/18 appears to be atrial beats at 100 and V rate less than or equal to 30 and even more important RBBB, which is change from chronic LBBB.     Past Medical History:  Diagnosis Date  . Abnormal ECG   . Appendicitis    1956  . CHF (congestive heart failure) (HCC)   . HTN (hypertension)     Past Surgical History:  Procedure Laterality Date  . APPENDECTOMY       Medications Prior to Admission: Prior to Admission medications   Medication Sig Start Date End Date  Taking? Authorizing Provider  aspirin 81 MG tablet Take 81 mg by mouth daily.    [provider]  BREO ELLIPTA 200-25 MCG/INH AEPB Inhale 1 puff into the lungs daily. 05/08/18   [provider]  cetirizine (ZYRTEC) 10 MG tablet Take 10 mg by mouth daily.    [provider]  mirtazapine (REMERON) 15 MG tablet Take 1 tablet by mouth at bedtime.    [provider]  Multiple Vitamin (MULTIVITAMIN) capsule Take 1 capsule by mouth daily.    [provider]  omeprazole (PRILOSEC) 40 MG capsule Take 1 capsule by mouth daily as needed (for heartburn).  10/06/12   [provider]  psyllium (METAMUCIL) 58.6 % packet Take 1 packet by mouth daily as needed (for constipation).     [provider]  VENTOLIN HFA 108 (90 BASE) MCG/ACT inhaler Inhale 2 puffs into the lungs every 6 (six) hours as needed for wheezing or shortness of breath.  09/10/14   [provider]  vitamin B-12 (CYANOCOBALAMIN) 500 MCG tablet Take 1,000 mcg by mouth daily.     [provider]     Allergies:    Allergies  Allergen Reactions  . Ace Inhibitors Cough    cough    Social History:   Social History   Socioeconomic History  . Marital status: Married    Spouse name: Not on  file  . Number of children: Not on file  . Years of education: Not on file  . Highest education level: Not on file  Occupational History  . Not on file  Social Needs  . Financial resource strain: Not on file  . Food insecurity:    Worry: Not on file    Inability: Not on file  . Transportation needs:    Medical: Not on file    Non-medical: Not on file  Tobacco Use  . Smoking status: Former Smoker    Packs/day: 0.70    Years: 5.00    Pack years: 3.50    Types: Cigarettes  . Smokeless tobacco: Current User    Types: Chew  Substance and Sexual Activity  . Alcohol use: No    Alcohol/week: 0.0 standard drinks  . Drug use: No  . Sexual activity: Not on file  Lifestyle    . Physical activity:    Days per week: Not on file    Minutes per session: Not on file  . Stress: Not on file  Relationships  . Social connections:    Talks on phone: Not on file    Gets together: Not on file    Attends religious service: Not on file    Active member of club or organization: Not on file    Attends meetings of clubs or organizations: Not on file    Relationship status: Not on file  . Intimate partner violence:    Fear of current or ex partner: Not on file    Emotionally abused: Not on file    Physically abused: Not on file    Forced sexual activity: Not on file  Other Topics Concern  . Not on file  Social History Narrative  . Not on file    Family History:   The patient's family history includes Kidney failure in his sister; Stroke in his mother.    ROS:  Please see the history of present illness.  General:no colds or fevers, per record severe wt loss ? From 168 to 135, doubt the 168 is accurate.  Skin:no rashes or ulcers HEENT:no blurred vision, no congestion CV:see HPI PUL:see HPI GI:no diarrhea constipation or melena, no indigestion GU:no hematuria, no dysuria MS:no joint pain, no claudication Neuro:no syncope, no lightheadedness Endo:no diabetes, no thyroid disease .     Physical Exam/Data:   Vitals:   05/22/18 1645 05/22/18 1715 05/22/18 1730 05/22/18 1745  BP: 106/86 (!) 147/73 (!) 150/81 (!) 145/79  Pulse: 87 75 77 78  Resp: (!) 21 (!) 22 (!) 25 (!) 24  Temp: 97.6 F (36.4 C)     TempSrc: Oral     SpO2: 99% 100% 100% 100%  Weight: 62.6 kg     Height: 5\' 10"  (1.778 m)      No intake or output data in the 24 hours ending 05/22/18 1751 Filed Weights   05/22/18 1645  Weight: 62.6 kg   Body mass index is 19.8 kg/m.  General:  Frail white male, in no acute distress HEENT: normal Lymph: no adenopathy Neck: no JVD Endocrine:  No thryomegaly Vascular: No carotid bruits; pedal pulses 2+ bilaterally  Cardiac:  normal S1, S2; RRR; no  murmur , gallup rub or click Lungs:  clear to auscultation bilaterally, no wheezing, rhonchi or rales  Abd: soft, nontender, no hepatomegaly  Ext: no lower ext edema  Musculoskeletal:  No deformities, BUE and BLE strength normal and equal Skin: warm and dry  Neuro:  Alert  and oriented X 3 MAE follows commands, no focal abnormalities noted Psych:  Normal affect     Relevant CV Studies: Echo 2015 Study Conclusions  - Left ventricle: The cavity size was normal. Systolic function was mildly reduced. The estimated ejection fraction was in the range of 45% to 50%. Mild diffuse hypokinesis with no identifiable regional variations. Doppler parameters are consistent with abnormal left ventricular relaxation (grade 1 diastolic dysfunction). - Ventricular septum: Septal motion showed dyssynergy and paradox. These changes are consistent with intraventricular conduction delay. - Pulmonary arteries: Systolic pressure was mildly increased. PA peak pressure: 38 mm Hg (S).  Laboratory Data:  Chemistry Recent Labs  Lab 05/19/18 1608  NA 142  K 3.9  CL 107  CO2 28  GLUCOSE 100*  BUN 7*  CREATININE 0.84  CALCIUM 9.1  GFRNONAA >60  GFRAA >60  ANIONGAP 7    Recent Labs  Lab 05/19/18 1608  PROT 6.6  ALBUMIN 3.4*  AST 46*  ALT 15  ALKPHOS 82  BILITOT 1.0   Hematology Recent Labs  Lab 05/19/18 1608  WBC 7.4  RBC 4.57  HGB 14.0  HCT 43.0  MCV 94.1  MCH 30.6  MCHC 32.6  RDW 12.4  PLT 187   Cardiac EnzymesNo results for input(s): TROPONINI in the last 168 hours.  Recent Labs  Lab 05/19/18 1618 05/22/18 1709  TROPIPOC 0.00 0.00    BNPNo results for input(s): BNP, PROBNP in the last 168 hours.  DDimer No results for input(s): DDIMER in the last 168 hours.  Radiology/Studies:  Dg Chest 2 View  Result Date: 05/22/2018 CLINICAL DATA:  Left bundle-branch heart block. EXAM: CHEST - 2 VIEW COMPARISON:  CT 07/14/2017, CXR 05/19/2018 FINDINGS: Cardiomegaly  with mediastinal and hilar calcifications similar to prior with moderate degree of diffuse interstitial fibrosis. No overt pulmonary edema, effusion or pneumothorax. Nonaneurysmal appearing thoracic aorta. Biapical pleuroparenchymal thickening is identified. No acute osseous abnormality. IMPRESSION: Chronic interstitial lung disease with fibrosis. Mediastinal and hilar calcifications compatible with old granulomatous disease or possibly pneumoconiosis. Stable cardiomegaly. Electronically Signed   By: Tollie Eth M.D.   On: 05/22/2018 17:39    Assessment and Plan:   1. Symptomatic CHB with syncope and with this RBBB, once back in LBBB was SR.  Need EKG. (per EMS note it was second degree AV block but EKG does not show this).  HR 34 to 36.  On no rate slowing meds.  Will admit to tele or step down Dr. Duke Salvia to see.  EP consult tomorrow, most likely PPM. Will make NPO after MN   2.   Pulmonary fibrosis on 3 L [place] at home.  Is on Hospice but will cancel if needed for treatment.    3.   Hx of cardiomyopathy but most recent echo 2015 EF was 45-50% and PA pk pressure was 38 mmHg.  Will check echo    Severity of Illness: The appropriate patient status for this patient is INPATIENT. Inpatient status is judged to be reasonable and necessary in order to provide the required intensity of service to ensure the patient's safety. The patient's presenting symptoms, physical exam findings, and initial radiographic and laboratory data in the context of their chronic comorbidities is felt to place them at high risk for further clinical deterioration. Furthermore, it is not anticipated that the patient will be medically stable for discharge from the hospital within 2 midnights of admission. The following factors support the patient status of inpatient.   " The patient's  presenting symptoms include syncope, and near syncope. " The worrisome physical exam findings include LBBB SR now but CHB with alternating BBB on  Friday. " The initial radiographic and laboratory data are worrisome because of stable " The chronic co-morbidities include pulmonary fibrosis, oxygen therapy   * I certify that at the point of admission it is my clinical judgment that the patient will require inpatient hospital care spanning beyond 2 midnights from the point of admission due to high intensity of service, high risk for further deterioration and high frequency of surveillance required.*    For questions or updates, please contact [facility] Please consult [email or web address] for contact info under        Signed, Nada Boozer, NP  05/22/2018 5:51 PM

## 2018-05-23 ENCOUNTER — Inpatient Hospital Stay (HOSPITAL_COMMUNITY): Payer: Medicare Other

## 2018-05-23 ENCOUNTER — Inpatient Hospital Stay (HOSPITAL_COMMUNITY)
Admission: EM | Disposition: A | Discharge status: Home / Self Care | Payer: Self-pay | Source: Home / Self Care | Attending: Cardiovascular Disease

## 2018-05-23 DIAGNOSIS — I442 Atrioventricular block, complete: Secondary | ICD-10-CM

## 2018-05-23 DIAGNOSIS — I361 Nonrheumatic tricuspid (valve) insufficiency: Secondary | ICD-10-CM

## 2018-05-23 HISTORY — PX: PACEMAKER IMPLANT: EP1218

## 2018-05-23 LAB — CBC
HEMATOCRIT: 44.2 % (ref 39.0–52.0)
HEMOGLOBIN: 14 g/dL (ref 13.0–17.0)
MCH: 29.5 pg (ref 26.0–34.0)
MCHC: 31.7 g/dL (ref 30.0–36.0)
MCV: 93.1 fL (ref 80.0–100.0)
Platelets: 209 10*3/uL (ref 150–400)
RBC: 4.75 MIL/uL (ref 4.22–5.81)
RDW: 12.4 % (ref 11.5–15.5)
WBC: 6.2 10*3/uL (ref 4.0–10.5)
nRBC: 0 % (ref 0.0–0.2)

## 2018-05-23 LAB — LIPID PANEL
Cholesterol: 142 mg/dL (ref 0–200)
HDL: 44 mg/dL (ref >40)
LDL Cholesterol: 75 mg/dL (ref 0–99)
TRIGLYCERIDES: 116 mg/dL (ref <150)
Total CHOL/HDL Ratio: 3.2 RATIO
VLDL: 23 mg/dL (ref 0–40)

## 2018-05-23 LAB — ECHOCARDIOGRAM COMPLETE
Height: 70 in
Weight: 2129.76 oz

## 2018-05-23 LAB — BASIC METABOLIC PANEL
Anion gap: 7 (ref 5–15)
BUN: 7 mg/dL — AB (ref 8–23)
CHLORIDE: 103 mmol/L (ref 98–111)
CO2: 32 mmol/L (ref 22–32)
Calcium: 9.6 mg/dL (ref 8.9–10.3)
Creatinine, Ser: 0.86 mg/dL (ref 0.61–1.24)
GFR calc Af Amer: >60 mL/min (ref >60)
GFR calc non Af Amer: >60 mL/min (ref >60)
Glucose, Bld: 94 mg/dL (ref 70–99)
POTASSIUM: 4 mmol/L (ref 3.5–5.1)
Sodium: 142 mmol/L (ref 135–145)

## 2018-05-23 LAB — TROPONIN I: Troponin I: <0.03 ng/mL (ref <0.03)

## 2018-05-23 LAB — MRSA PCR SCREENING: MRSA by PCR: NEGATIVE

## 2018-05-23 SURGERY — PACEMAKER IMPLANT

## 2018-05-23 MED ORDER — LIDOCAINE HCL (PF) 1 % IJ SOLN
INTRAMUSCULAR | Status: DC | PRN
  Administered 2018-05-23: 30 mL

## 2018-05-23 MED ORDER — LIDOCAINE HCL (PF) 1 % IJ SOLN
INTRAMUSCULAR | Status: AC
  Filled 2018-05-23: qty 30

## 2018-05-23 MED ORDER — SODIUM CHLORIDE 0.9 % IV SOLN
INTRAVENOUS | Status: AC
  Filled 2018-05-23: qty 2

## 2018-05-23 MED ORDER — CEFAZOLIN SODIUM-DEXTROSE 2-4 GM/100ML-% IV SOLN
INTRAVENOUS | Status: AC
  Filled 2018-05-23: qty 100

## 2018-05-23 MED ORDER — CHLORHEXIDINE GLUCONATE 4 % EX LIQD
60.0000 mL | Freq: Once | CUTANEOUS | Status: AC
  Administered 2018-05-23: 4 via TOPICAL
  Filled 2018-05-23: qty 60

## 2018-05-23 MED ORDER — OMEPRAZOLE MAGNESIUM 20 MG PO TBEC: 20.0000 mg | DELAYED_RELEASE_TABLET | Freq: Every day | ORAL | Status: DC | PRN

## 2018-05-23 MED ORDER — PANTOPRAZOLE SODIUM 40 MG PO TBEC: 40.0000 mg | DELAYED_RELEASE_TABLET | Freq: Every day | ORAL | Status: DC

## 2018-05-23 MED ORDER — HEPARIN (PORCINE) IN NACL 1000-0.9 UT/500ML-% IV SOLN
INTRAVENOUS | Status: DC | PRN
  Administered 2018-05-23: 500 mL

## 2018-05-23 MED ORDER — FENTANYL CITRATE (PF) 100 MCG/2ML IJ SOLN
INTRAMUSCULAR | Status: DC | PRN
  Administered 2018-05-23: 12.5 ug via INTRAVENOUS

## 2018-05-23 MED ORDER — CHLORHEXIDINE GLUCONATE 4 % EX LIQD: 60.0000 mL | Freq: Once | CUTANEOUS | Status: AC

## 2018-05-23 MED ORDER — FENTANYL CITRATE (PF) 100 MCG/2ML IJ SOLN
INTRAMUSCULAR | Status: AC
  Filled 2018-05-23: qty 2

## 2018-05-23 MED ORDER — CEFAZOLIN SODIUM-DEXTROSE 1-4 GM/50ML-% IV SOLN
1.0000 g | Freq: Four times a day (QID) | INTRAVENOUS | Status: AC
  Administered 2018-05-23 – 2018-05-24 (×3): 1 g via INTRAVENOUS
  Filled 2018-05-23 (×3): qty 50

## 2018-05-23 MED ORDER — FLUTICASONE FUROATE-VILANTEROL 200-25 MCG/INH IN AEPB: 1.0000 | INHALATION_SPRAY | Freq: Every day | RESPIRATORY_TRACT | Status: DC

## 2018-05-23 MED ORDER — SODIUM CHLORIDE 0.9 % IV SOLN: 250.0000 mL | INTRAVENOUS | Status: DC

## 2018-05-23 MED ORDER — CEFAZOLIN SODIUM-DEXTROSE 2-4 GM/100ML-% IV SOLN
2.0000 g | INTRAVENOUS | Status: AC
  Administered 2018-05-23: 2 g via INTRAVENOUS

## 2018-05-23 MED ORDER — ONDANSETRON HCL 4 MG/2ML IJ SOLN: 4.0000 mg | Freq: Four times a day (QID) | INTRAMUSCULAR | Status: DC | PRN

## 2018-05-23 MED ORDER — SODIUM CHLORIDE 0.9 % IV SOLN
80.0000 mg | INTRAVENOUS | Status: AC
  Administered 2018-05-23: 80 mg

## 2018-05-23 MED ORDER — ADULT MULTIVITAMIN W/MINERALS CH: 1.0000 | ORAL_TABLET | Freq: Every day | ORAL | Status: DC

## 2018-05-23 MED ORDER — HEPARIN (PORCINE) IN NACL 1000-0.9 UT/500ML-% IV SOLN
INTRAVENOUS | Status: AC
  Filled 2018-05-23: qty 500

## 2018-05-23 MED ORDER — ACETAMINOPHEN 325 MG PO TABS
325.0000 mg | ORAL_TABLET | ORAL | Status: DC | PRN
  Administered 2018-05-24 – 2018-05-27 (×3): 650 mg via ORAL
  Filled 2018-05-23 (×3): qty 2

## 2018-05-23 MED ORDER — LORATADINE 10 MG PO TABS: 10.0000 mg | ORAL_TABLET | Freq: Every day | ORAL | Status: DC

## 2018-05-23 MED ORDER — ASPIRIN EC 81 MG PO TBEC: 81.0000 mg | DELAYED_RELEASE_TABLET | Freq: Every day | ORAL | Status: DC

## 2018-05-23 MED ORDER — SODIUM CHLORIDE 0.9 % IV SOLN
INTRAVENOUS | Status: DC
  Administered 2018-05-23: 13:00:00 via INTRAVENOUS

## 2018-05-23 MED ORDER — SODIUM CHLORIDE 0.9% FLUSH
3.0000 mL | Freq: Two times a day (BID) | INTRAVENOUS | Status: DC
  Administered 2018-05-23: 3 mL via INTRAVENOUS

## 2018-05-23 MED ORDER — ALBUTEROL SULFATE HFA 108 (90 BASE) MCG/ACT IN AERS: 1.0000 | INHALATION_SPRAY | Freq: Four times a day (QID) | RESPIRATORY_TRACT | Status: DC | PRN

## 2018-05-23 MED ORDER — SODIUM CHLORIDE 0.9% FLUSH: 3.0000 mL | INTRAVENOUS | Status: DC | PRN

## 2018-05-23 MED ORDER — PANTOPRAZOLE SODIUM 40 MG PO TBEC
40.0000 mg | DELAYED_RELEASE_TABLET | Freq: Every day | ORAL | Status: DC
  Administered 2018-05-24 – 2018-05-28 (×5): 40 mg via ORAL
  Filled 2018-05-23 (×5): qty 1

## 2018-05-23 SURGICAL SUPPLY — 12 items
CABLE SURGICAL S-101-97-12 (CABLE) ×3 IMPLANT
CATH RIGHTSITE C315HIS02 (CATHETERS) ×2 IMPLANT
IPG PACE AZUR XT DR MRI W1DR01 (Pacemaker) IMPLANT
LEAD CAPSURE NOVUS 5076-52CM (Lead) ×2 IMPLANT
LEAD SELECT SECURE 3830 383069 (Lead) IMPLANT
PACE AZURE XT DR MRI W1DR01 (Pacemaker) ×3 IMPLANT
PAD PRO RADIOLUCENT 2001M-C (PAD) ×3 IMPLANT
SELECT SECURE 3830 383069 (Lead) ×3 IMPLANT
SHEATH CLASSIC 7F (SHEATH) ×4 IMPLANT
SLITTER 6232ADJ (MISCELLANEOUS) ×2 IMPLANT
TRAY PACEMAKER INSERTION (PACKS) ×3 IMPLANT
WIRE HI TORQ VERSACORE-J 145CM (WIRE) ×2 IMPLANT

## 2018-05-23 NOTE — Consult Note (Addendum)
Cardiology Consultation:   Patient ID: Dale Winters MRN: [id number]; DOB: 19-Jun-1941  Admit date: 05/22/2018 Date of Consult: 05/23/2018  Primary Care Provider: Lise Auer, MD Primary Cardiologist: Gypsy Balsam, MD (Dr. Eden Emms 2016) Primary Electrophysiologist:  None    Patient Profile:   Dale Winters is a 77 y.o. male with a hx of idiopathic pulmonary fibrosis on home O2, LBBB, mention of a DCM, HTN who is being seen today for the evaluation of advanced heart block inclluding CHB with syncope at the request of Dr. Duke Salvia.  History of Present Illness:   Dale Winters was at his PMD 05/19/18 for what appears a scheduled or routine visit, note reports while being brought back to the exam room he became near syncopal, and EKG was done noting advanced heart block (thought to be 2nd degree) EMS was called and he was transported to the ER.  Upon arrival back in SR w/1;1 conduction without further evidence of heart block was recommended he be referred to cardiology out patient for long term monitoring.  He saw Dr. Bing Matter yesterday reporting a number of near syncopal events.  The patient reports a long history of dizziness upon standing, though denies any otherwise.  His daughter reports differently, that since about Jan of this year they have observed him with weak, near fainting while seated as well.  No reports of full syncope/falls or trauma.  No CP, palpitations.  He has baseline SOB.  LABS K+ 4.2 Mag 2.3 BUN/Creat 9/0.72 Trop I: <0.03 (x2) WBC 6.5 H/H 15/49 Plts 234 TSH 6.304 Free T4 0.83  Home meds reviewed, no potential rate limiting/nodal blocking agents noted  Past Medical History:  Diagnosis Date  . Abnormal ECG   . Appendicitis    1956  . CHF (congestive heart failure) (HCC)   . HTN (hypertension)     Past Surgical History:  Procedure Laterality Date  . APPENDECTOMY       Home Medications:  Prior to Admission medications   Medication Sig Start Date End  Date Taking? Authorizing Provider  albuterol (PROVENTIL HFA;VENTOLIN HFA) 108 (90 Base) MCG/ACT inhaler Inhale 1 puff into the lungs every 6 (six) hours as needed for wheezing or shortness of breath.   Yes [provider]  aspirin EC 81 MG tablet Take 81 mg by mouth at bedtime.   Yes [provider]  fluticasone furoate-vilanterol (BREO ELLIPTA) 200-25 MCG/INH AEPB Inhale 1 puff into the lungs daily.   Yes [provider]  loratadine (CLARITIN) 10 MG tablet Take 10 mg by mouth daily.   Yes [provider]  mirtazapine (REMERON) 15 MG tablet Take 15 mg by mouth at bedtime.    Yes [provider]  Multiple Vitamin (MULTIVITAMIN WITH MINERALS) TABS tablet Take 1 tablet by mouth daily.   Yes [provider]  omeprazole (PRILOSEC OTC) 20 MG tablet Take 20 mg by mouth daily as needed (stomach acid).   Yes [provider]  OXYGEN Inhale 2-4 L into the lungs continuous.   Yes [provider]  Probiotic Product (PROBIOTIC PO) Take 1 tablet by mouth daily.   Yes [provider]  Psyllium (METAMUCIL PO) Take 1 Scoop by mouth at bedtime.   Yes [provider]  vitamin B-12 (CYANOCOBALAMIN) 500 MCG tablet Take 500 mcg by mouth daily.    Yes [provider]    Inpatient Medications: Scheduled Meds: . aspirin EC  81 mg Oral Daily  . atorvastatin  20 mg Oral q1800  .  fluticasone furoate-vilanterol  1 puff Inhalation Daily  . heparin  5,000 Units Subcutaneous Q8H  . loratadine  10 mg Oral Daily  . mirtazapine  15 mg Oral QHS  . multivitamin with minerals  1 tablet Oral Daily  . pantoprazole  40 mg Oral Daily  . vitamin B-12  1,000 mcg Oral Daily   Continuous Infusions: . sodium chloride 10 mL/hr at 05/22/18 2123   PRN Meds: acetaminophen, albuterol, nitroGLYCERIN, ondansetron (ZOFRAN) IV, psyllium  Allergies:    Allergies  Allergen Reactions  . Ace Inhibitors Cough    Social History:   Social  History   Socioeconomic History  . Marital status: Married    Spouse name: Not on file  . Number of children: Not on file  . Years of education: Not on file  . Highest education level: Not on file  Occupational History  . Not on file  Social Needs  . Financial resource strain: Not on file  . Food insecurity:    Worry: Not on file    Inability: Not on file  . Transportation needs:    Medical: Not on file    Non-medical: Not on file  Tobacco Use  . Smoking status: Former Smoker    Packs/day: 0.70    Years: 5.00    Pack years: 3.50    Types: Cigarettes  . Smokeless tobacco: Current User    Types: Chew  Substance and Sexual Activity  . Alcohol use: No    Alcohol/week: 0.0 standard drinks  . Drug use: No  . Sexual activity: Not on file  Lifestyle  . Physical activity:    Days per week: Not on file    Minutes per session: Not on file  . Stress: Not on file  Relationships  . Social connections:    Talks on phone: Not on file    Gets together: Not on file    Attends religious service: Not on file    Active member of club or organization: Not on file    Attends meetings of clubs or organizations: Not on file    Relationship status: Not on file  . Intimate partner violence:    Fear of current or ex partner: Not on file    Emotionally abused: Not on file    Physically abused: Not on file    Forced sexual activity: Not on file  Other Topics Concern  . Not on file  Social History Narrative  . Not on file    Family History:   Family History  Problem Relation Age of Onset  . Stroke Mother   . Kidney failure Sister      ROS:  Please see the history of present illness.  All other ROS reviewed and negative.     Physical Exam/Data:   Vitals:   05/22/18 2200 05/22/18 2230 05/22/18 2315 05/23/18 0500  BP: 137/70 132/70 (!) 150/81 135/78  Pulse: 72 69    Resp: 18 (!) 23 (!) 27 13  Temp:   (!) 97.5 F (36.4 C) 98.1 F (36.7 C)  TempSrc:   Oral Oral  SpO2: 100%  100% 98%   Weight:   60.4 kg 60.4 kg  Height:   5\' 10"  (1.778 m)     Intake/Output Summary (Last 24 hours) at 05/23/2018 0733 Last data filed at 05/23/2018 0600 Gross per 24 hour  Intake 91.11 ml  Output -  Net 91.11 ml   Filed Weights   05/22/18 1645 05/22/18 2315 05/23/18 0500  Weight:  62.6 kg 60.4 kg 60.4 kg   Body mass index is 19.1 kg/m.  General:  Well nourished, well developed though thin, in no acute distress HEENT: normal Lymph: no adenopathy Neck: no JVD Endocrine:  No thryomegaly Vascular: No carotid bruits Cardiac:  RRR; no murmurs, gallops or rubs Lungs:  Diminished throughout, no wheezing, rhonchi or rales  Abd: soft, nontender  Ext: no edema Musculoskeletal:  No deformities,age appropriate atrophy Skin: warm and dry  Neuro:  no focal abnormalities noted Psych:  Normal affect, very pleasant   EKG:  The EKG was personally reviewed and demonstrates: Today SR, LBBB, LAD 69bpm 05/19/18 CHB, RBBB, V rate 34bpm, A rate110   Telemetry:  Telemetry was personally reviewed and demonstrates:   SR  Relevant CV Studies:  Echo ordered for stat this AM  06/07/14: TTE Study Conclusions - Left ventricle: The cavity size was normal. Systolic function was mildly reduced. The estimated ejection fraction was in the range of 45% to 50%. Mild diffuse hypokinesis with no identifiable regional variations. Doppler parameters are consistent with abnormal left ventricular relaxation (grade 1 diastolic dysfunction). - Ventricular septum: Septal motion showed dyssynergy and paradox. These changes are consistent with intraventricular conduction delay. - Pulmonary arteries: Systolic pressure was mildly increased. PA peak pressure: 38 mm Hg (S).  Laboratory Data:  Chemistry Recent Labs  Lab 05/19/18 1608 05/22/18 1659 05/22/18 2019  NA 142 140  --   K 3.9 4.2  --   CL 107 101  --   CO2 28 29  --   GLUCOSE 100* 95  --   BUN 7* 9  --   CREATININE 0.84  0.72 0.74  CALCIUM 9.1 9.6  --   GFRNONAA >60 >60 >60  GFRAA >60 >60 >60  ANIONGAP 7 10  --     Recent Labs  Lab 05/19/18 1608 05/22/18 2019  PROT 6.6 8.2*  ALBUMIN 3.4* 4.2  AST 46* 32  ALT 15 19  ALKPHOS 82 104  BILITOT 1.0 0.8   Hematology Recent Labs  Lab 05/19/18 1608 05/22/18 1659 05/22/18 2019  WBC 7.4 7.1 6.5  RBC 4.57 5.00 5.31  HGB 14.0 14.9 15.6  HCT 43.0 47.0 49.7  MCV 94.1 94.0 93.6  MCH 30.6 29.8 29.4  MCHC 32.6 31.7 31.4  RDW 12.4 12.5 12.4  PLT 187 264 234   Cardiac Enzymes Recent Labs  Lab 05/22/18 2019 05/23/18 0032  TROPONINI <0.03 <0.03    Recent Labs  Lab 05/19/18 1618 05/22/18 1709  TROPIPOC 0.00 0.00    BNPNo results for input(s): BNP, PROBNP in the last 168 hours.  DDimer No results for input(s): DDIMER in the last 168 hours.  Radiology/Studies:   Dg Chest 2 View Result Date: 05/22/2018 CLINICAL DATA:  Left bundle-branch heart block. EXAM: CHEST - 2 VIEW COMPARISON:  CT 07/14/2017, CXR 05/19/2018 FINDINGS: Cardiomegaly with mediastinal and hilar calcifications similar to prior with moderate degree of diffuse interstitial fibrosis. No overt pulmonary edema, effusion or pneumothorax. Nonaneurysmal appearing thoracic aorta. Biapical pleuroparenchymal thickening is identified. No acute osseous abnormality. IMPRESSION: Chronic interstitial lung disease with fibrosis. Mediastinal and hilar calcifications compatible with old granulomatous disease or possibly pneumoconiosis. Stable cardiomegaly. Electronically Signed   By: Tollie Eth M.D.   On: 05/22/2018 17:39     Assessment and Plan:   1. Syncope 2. CHB 3. 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  Dr. Ladona Ridgel will see    4.  HTN     Not on any BP meds at home     BP looks OK  5. IPF     On home O2, hospice care, though patient/daughter report not at "end of life care" home health needs only, and off hospice for his hospitalization    For questions or updates, please contact [facility] Please consult [email or web address] for contact info under     Signed, Sheilah Pigeon, PA-C  05/23/2018 7:33 AM  EP Attending  Patient seen and examined. Agree with above. 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 I have reviewed the indications/risks/benefits/goals/expectations of PPM insertion with the patient and he is willing to proceed.  Leonia Reeves.D.

## 2018-05-23 NOTE — Progress Notes (Signed)
  Echocardiogram 2D Echocardiogram has been performed.  Luiscarlos Kaczmarczyk L Androw 05/23/2018, 8:30 AM

## 2018-05-23 NOTE — Addendum Note (Signed)
Addended by: Lita Mains on: 05/23/2018 09:23 AM   Modules accepted: Orders

## 2018-05-24 ENCOUNTER — Inpatient Hospital Stay (HOSPITAL_COMMUNITY): Payer: Medicare Other

## 2018-05-24 ENCOUNTER — Ambulatory Visit (HOSPITAL_COMMUNITY): Admit: 2018-05-24 | Payer: PRIVATE HEALTH INSURANCE | Admitting: Cardiovascular Disease

## 2018-05-24 ENCOUNTER — Encounter (HOSPITAL_COMMUNITY)
Admission: EM | Disposition: A | Discharge status: Home / Self Care | Payer: Self-pay | Source: Home / Self Care | Attending: Cardiovascular Disease

## 2018-05-24 ENCOUNTER — Encounter (HOSPITAL_COMMUNITY): Payer: Self-pay | Admitting: Internal Medicine

## 2018-05-24 DIAGNOSIS — I959 Hypotension, unspecified: Secondary | ICD-10-CM

## 2018-05-24 DIAGNOSIS — R569 Unspecified convulsions: Secondary | ICD-10-CM

## 2018-05-24 DIAGNOSIS — G9341 Metabolic encephalopathy: Secondary | ICD-10-CM

## 2018-05-24 DIAGNOSIS — I314 Cardiac tamponade: Secondary | ICD-10-CM

## 2018-05-24 DIAGNOSIS — I442 Atrioventricular block, complete: Principal | ICD-10-CM

## 2018-05-24 DIAGNOSIS — F05 Delirium due to known physiological condition: Secondary | ICD-10-CM

## 2018-05-24 DIAGNOSIS — I361 Nonrheumatic tricuspid (valve) insufficiency: Secondary | ICD-10-CM

## 2018-05-24 HISTORY — PX: PERICARDIOCENTESIS: CATH118255

## 2018-05-24 LAB — MRSA PCR SCREENING: MRSA by PCR: NEGATIVE

## 2018-05-24 LAB — ECHOCARDIOGRAM LIMITED
Height: 70 in
Height: 70 in
Weight: 2134.05 oz
Weight: 2134.05 oz

## 2018-05-24 LAB — BASIC METABOLIC PANEL
Anion gap: 12 (ref 5–15)
BUN: 12 mg/dL (ref 8–23)
CALCIUM: 9 mg/dL (ref 8.9–10.3)
CHLORIDE: 107 mmol/L (ref 98–111)
CO2: 19 mmol/L — ABNORMAL LOW (ref 22–32)
Creatinine, Ser: 1.27 mg/dL — ABNORMAL HIGH (ref 0.61–1.24)
GFR, EST NON AFRICAN AMERICAN: 52 mL/min — AB (ref >60)
Glucose, Bld: 156 mg/dL — ABNORMAL HIGH (ref 70–99)
Potassium: 4.1 mmol/L (ref 3.5–5.1)
SODIUM: 138 mmol/L (ref 135–145)

## 2018-05-24 LAB — CBC
HCT: 43.2 % (ref 39.0–52.0)
HEMOGLOBIN: 13.9 g/dL (ref 13.0–17.0)
MCH: 30.2 pg (ref 26.0–34.0)
MCHC: 32.2 g/dL (ref 30.0–36.0)
MCV: 93.9 fL (ref 80.0–100.0)
NRBC: 0 % (ref 0.0–0.2)
Platelets: 183 10*3/uL (ref 150–400)
RBC: 4.6 MIL/uL (ref 4.22–5.81)
RDW: 12.4 % (ref 11.5–15.5)
WBC: 14.7 10*3/uL — ABNORMAL HIGH (ref 4.0–10.5)

## 2018-05-24 LAB — CBC WITH DIFFERENTIAL/PLATELET
ABS IMMATURE GRANULOCYTES: 0.09 10*3/uL — AB (ref 0.00–0.07)
Basophils Absolute: 0 10*3/uL (ref 0.0–0.1)
Basophils Relative: 0 %
Eosinophils Absolute: 0 10*3/uL (ref 0.0–0.5)
Eosinophils Relative: 0 %
HCT: 38.1 % — ABNORMAL LOW (ref 39.0–52.0)
Hemoglobin: 12.8 g/dL — ABNORMAL LOW (ref 13.0–17.0)
Immature Granulocytes: 1 %
LYMPHS ABS: 0.3 10*3/uL — AB (ref 0.7–4.0)
Lymphocytes Relative: 2 %
MCH: 30.7 pg (ref 26.0–34.0)
MCHC: 33.6 g/dL (ref 30.0–36.0)
MCV: 91.4 fL (ref 80.0–100.0)
MONO ABS: 1.5 10*3/uL — AB (ref 0.1–1.0)
MONOS PCT: 9 %
NEUTROS ABS: 15.9 10*3/uL — AB (ref 1.7–7.7)
Neutrophils Relative %: 88 %
Platelets: 160 10*3/uL (ref 150–400)
RBC: 4.17 MIL/uL — AB (ref 4.22–5.81)
RDW: 12.4 % (ref 11.5–15.5)
WBC: 17.8 10*3/uL — ABNORMAL HIGH (ref 4.0–10.5)
nRBC: 0 % (ref 0.0–0.2)

## 2018-05-24 LAB — ABO/RH: ABO/RH(D): A NEG

## 2018-05-24 LAB — TYPE AND SCREEN
ABO/RH(D): A NEG
Antibody Screen: NEGATIVE

## 2018-05-24 LAB — GLUCOSE, CAPILLARY: Glucose-Capillary: 174 mg/dL — ABNORMAL HIGH (ref 70–99)

## 2018-05-24 SURGERY — PERICARDIOCENTESIS
Anesthesia: LOCAL

## 2018-05-24 MED ORDER — LIDOCAINE HCL (PF) 1 % IJ SOLN
INTRAMUSCULAR | Status: AC
  Filled 2018-05-24: qty 30

## 2018-05-24 MED ORDER — LIDOCAINE HCL (PF) 1 % IJ SOLN
INTRAMUSCULAR | Status: DC | PRN
  Administered 2018-05-24: 5 mL

## 2018-05-24 MED ORDER — SODIUM CHLORIDE 0.9% FLUSH
5.0000 mL | Freq: Three times a day (TID) | INTRAVENOUS | Status: DC
  Administered 2018-05-24 – 2018-05-27 (×5): 5 mL

## 2018-05-24 MED ORDER — NOREPINEPHRINE 4 MG/250ML-% IV SOLN
INTRAVENOUS | Status: AC
  Filled 2018-05-24: qty 250

## 2018-05-24 MED ORDER — NOREPINEPHRINE BITARTRATE 1 MG/ML IV SOLN
INTRAVENOUS | Status: DC | PRN
  Administered 2018-05-24: 10 ug/min via INTRAVENOUS

## 2018-05-24 MED ORDER — ONDANSETRON HCL 4 MG/2ML IJ SOLN
INTRAMUSCULAR | Status: AC
  Filled 2018-05-24: qty 2

## 2018-05-24 MED ORDER — LEVETIRACETAM 500 MG PO TABS
500.0000 mg | ORAL_TABLET | Freq: Two times a day (BID) | ORAL | Status: DC
  Administered 2018-05-24 – 2018-05-28 (×8): 500 mg via ORAL
  Filled 2018-05-24: qty 2
  Filled 2018-05-24: qty 1
  Filled 2018-05-24 (×4): qty 2
  Filled 2018-05-24: qty 1
  Filled 2018-05-24: qty 2

## 2018-05-24 MED ORDER — ONDANSETRON HCL 4 MG/2ML IJ SOLN
INTRAMUSCULAR | Status: DC | PRN
  Administered 2018-05-24: 4 mg via INTRAVENOUS

## 2018-05-24 MED ORDER — HEPARIN (PORCINE) IN NACL 1000-0.9 UT/500ML-% IV SOLN
INTRAVENOUS | Status: DC | PRN
  Administered 2018-05-24: 500 mL

## 2018-05-24 MED ORDER — HEPARIN (PORCINE) IN NACL 1000-0.9 UT/500ML-% IV SOLN
INTRAVENOUS | Status: AC
  Filled 2018-05-24: qty 500

## 2018-05-24 MED ORDER — IOHEXOL 350 MG/ML SOLN
INTRAVENOUS | Status: DC | PRN
  Administered 2018-05-24: 5 mL

## 2018-05-24 MED ORDER — ORAL CARE MOUTH RINSE
15.0000 mL | Freq: Two times a day (BID) | OROMUCOSAL | Status: DC
  Administered 2018-05-24 – 2018-05-27 (×4): 15 mL via OROMUCOSAL

## 2018-05-24 MED ORDER — LEVETIRACETAM IN NACL 1000 MG/100ML IV SOLN
1000.0000 mg | INTRAVENOUS | Status: AC
  Administered 2018-05-24: 1000 mg via INTRAVENOUS
  Filled 2018-05-24: qty 100

## 2018-05-24 MED FILL — Lidocaine HCl Local Preservative Free (PF) Inj 1%: INTRAMUSCULAR | Qty: 30 | Status: AC

## 2018-05-24 SURGICAL SUPPLY — 7 items
KIT HEART LEFT (KITS) ×1 IMPLANT
PACK CARDIAC CATHETERIZATION (CUSTOM PROCEDURE TRAY) ×1 IMPLANT
PERIVAC PERICARDIOCENTESIS 8.3 (TRAY / TRAY PROCEDURE) ×1 IMPLANT
PINNACLE LONG 5F 25CM (SHEATH) ×2
SHEATH INTROD PINNACLE 5F 25CM (SHEATH) IMPLANT
SHEATH PROBE COVER 6X72 (BAG) ×1 IMPLANT
TRANSDUCER W/STOPCOCK (MISCELLANEOUS) ×1 IMPLANT

## 2018-05-24 NOTE — Progress Notes (Signed)
Orthopedic Tech Progress Note Patient Details:  Dale Winters August 27, 1940 [id number]  Ortho Devices Type of Ortho Device: Sling immobilizer Ortho Device/Splint Location: lue Ortho Device/Splint Interventions: Application   Post Interventions Patient Tolerated: Well Instructions Provided: Care of device   Nikki Dom 05/24/2018, 9:52 AM

## 2018-05-24 NOTE — Progress Notes (Addendum)
Pt has second seizure, started on keppra IV Remains hypotensive, with manual and auto cuff SBP 60's though difficult,  ST 120's Remains post ictal, tac hypnic, O2 sats 90's on n/c O2 Skin is warm and dry, he has palpable radial pulses, no signs of peripheral cyanosis, extremities are warm, variable BP readings  Neurology aware, 500cc bolus in process On IV Keppra  I have discussed with Dr. Ladona Ridgel Transfer to ICU, consult CCM team  I spoke with Dr. Tonia Brooms with CCM He will aid in patient's care, hypotension.  Given hx of Hospice prior to him coming 2/2 IPF will need to clarify with the patient's family goals/wishes.  Patient is transferring to 2H will get echo stat LABS  Francis Dowse, PA-C  CCM, Dr. Tonia Brooms is at bedside. Discussing with family code status, pt is en-route to 2H  Francis Dowse, PA-C  EP Attending I have been notified of the patient's findings. He has developed a pericardial effusion presumed due to his PPM insertion. He will require urgent pericardial drainage. He may have to have his leads revised.   Leonia Reeves.D.

## 2018-05-24 NOTE — Progress Notes (Signed)
PCCM:  Discussion with family at bedside (~12 family members, including wife and daughters). He was on hospice prior to this admission.   At this point with the change of events regarding his mental status and hypotension he would NOT want to be placed on life support.   Therefore, they are in agreement to pursue DDNR status.  Orders were changed. Ms. [name] was present for this discussion. Nursing staff was also present.   Dale Igo, DO [facility] 05/24/2018 12:37 PM  Personal pager: [phone] If unanswered, please page CCM On-call: #[phone]

## 2018-05-24 NOTE — Progress Notes (Addendum)
Progress Note  Patient Name: Dale Winters Date of Encounter: 05/24/2018  Primary Cardiologist: Gypsy Balsam, MD   Subjective   Patient seizing upon entering room, post ictal, sats to 50's during seizure, quickly recorvered back to 90's, telemetry remained SR/V pacing  Inpatient Medications    Scheduled Meds: . aspirin EC  81 mg Oral Daily  . atorvastatin  20 mg Oral q1800  . fluticasone furoate-vilanterol  1 puff Inhalation Daily  . loratadine  10 mg Oral Daily  . mirtazapine  15 mg Oral QHS  . multivitamin with minerals  1 tablet Oral Daily  . pantoprazole  40 mg Oral Daily  . vitamin B-12  1,000 mcg Oral Daily   Continuous Infusions: . sodium chloride Stopped (05/24/18 0544)  .  ceFAZolin (ANCEF) IV 1 g (05/24/18 0903)   PRN Meds: acetaminophen, albuterol, nitroGLYCERIN, ondansetron (ZOFRAN) IV, psyllium   Vital Signs    Vitals:   05/23/18 2000 05/23/18 2100 05/24/18 0003 05/24/18 0418  BP: 114/63 (!) 110/56 114/81 125/75  Pulse: 75 73 72   Resp: 17 20 (!) 21   Temp:   97.8 F (36.6 C) 98.3 F (36.8 C)  TempSrc:   Oral Oral  SpO2: 96% 96% 98%   Weight:    60.5 kg  Height:        Intake/Output Summary (Last 24 hours) at 05/24/2018 0904 Last data filed at 05/24/2018 0600 Gross per 24 hour  Intake 1290.65 ml  Output 425 ml  Net 865.65 ml   Filed Weights   05/22/18 2315 05/23/18 0500 05/24/18 0418  Weight: 60.4 kg 60.4 kg 60.5 kg    Telemetry    SR/V paced - Personally Reviewed  ECG    SR/Vpaced - Personally Reviewed  Physical Exam   GEN: post-ictal.   Neck: No JVD Cardiac: RRR, no murmurs, rubs, or gallops.  Respiratory: diminished throughout. GI: Soft, nontender MS: No edema; No deformity. Neuro:  post-ictal  Psych: unable to assess   Labs    Chemistry Recent Labs  Lab 05/19/18 1608 05/22/18 1659 05/22/18 2019 05/23/18 0704  NA 142 140  --  142  K 3.9 4.2  --  4.0  CL 107 101  --  103  CO2 28 29  --  32  GLUCOSE 100*  95  --  94  BUN 7* 9  --  7*  CREATININE 0.84 0.72 0.74 0.86  CALCIUM 9.1 9.6  --  9.6  PROT 6.6  --  8.2*  --   ALBUMIN 3.4*  --  4.2  --   AST 46*  --  32  --   ALT 15  --  19  --   ALKPHOS 82  --  104  --   BILITOT 1.0  --  0.8  --   GFRNONAA >60 >60 >60 >60  GFRAA >60 >60 >60 >60  ANIONGAP 7 10  --  7     Hematology Recent Labs  Lab 05/22/18 1659 05/22/18 2019 05/23/18 0704  WBC 7.1 6.5 6.2  RBC 5.00 5.31 4.75  HGB 14.9 15.6 14.0  HCT 47.0 49.7 44.2  MCV 94.0 93.6 93.1  MCH 29.8 29.4 29.5  MCHC 31.7 31.4 31.7  RDW 12.5 12.4 12.4  PLT 264 234 209    Cardiac Enzymes Recent Labs  Lab 05/22/18 2019 05/23/18 0032 05/23/18 0704  TROPONINI <0.03 <0.03 <0.03    Recent Labs  Lab 05/19/18 1618 05/22/18 1709  TROPIPOC 0.00 0.00  BNPNo results for input(s): BNP, PROBNP in the last 168 hours.   DDimer No results for input(s): DDIMER in the last 168 hours.   Radiology    CXR this AM, completed, reviewed with Dr. Ladona Ridgel, stable lead position, pending final read  Cardiac Studies   05/23/18: TTE Study Conclusions - Left ventricle: The cavity size was normal. There was mild   concentric hypertrophy. Systolic function was normal. The   estimated ejection fraction was in the range of 55% to 60%. Wall   motion was normal; there were no regional wall motion   abnormalities. There was an increased relative contribution of   atrial contraction to ventricular filling. Doppler parameters are   consistent with abnormal left ventricular relaxation (grade 1   diastolic dysfunction). - Ventricular septum: Septal motion showed moderate paradox. These   changes are consistent with intraventricular conduction delay. - Mitral valve: There was mild regurgitation. Valve area by   pressure half-time: 1.93 cm^2. - Atrial septum: There was increased thickness of the septum,   consistent with lipomatous hypertrophy. - Tricuspid valve: There was mild regurgitation. - Pulmonic  valve: There was mild regurgitation. - Pulmonary arteries: Systolic pressure could not be accurately   estimated.  Patient Profile     77 y.o. 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 CHB     S/p PPM yesterday     cxr reviewed by dr. Ladona Ridgel, stable lead position     Device interrogation this AM with intact function  2. Seizure this morning     Neuro consulted, has seen the patient     in d/w with Dr. Amada Jupiter, family mentioned a single prior seizure, not likely new onset     Pending CT     Remains post-ictal though improving     Management as per neurology, appreciate the help  Device recheck post-seizure, lead measurements remain stable  3. IPF     desated with seizure though improved quickly On home O2, hospice care, though patient/daughter report not at "end of life care" home health needs only, and off hospice for his hospitalization     For questions or updates, please contact [facility] Please consult [email or web address] for contact info under        Signed, Sheilah Pigeon, PA-C  05/24/2018, 9:04 AM    EP Attending  Patient seen and examined. On my arrival this morning the patient appeared to have a generalized tonic-clonic seizure. He was post ictal during my visit. His PM was interogated and appeared to be working normally. His CXR shows advanced pulmonary fibrosis. We have asked Dr. Amada Jupiter of neuro to see the patient and make recommendations. His discharge will be held up.  Leonia Reeves.D.

## 2018-05-24 NOTE — Consult Note (Addendum)
            [facility] CM Primary Care Navigator  05/24/2018  Osborne B Waldridge [birth year] [id number]   Went to see patient at the bedside to identify possible discharge needs but he was transferred from 6E 21  to 2H 22 (ICU) due to having seizures x 2 on the unit and hypotension.  Per MD note, patient presented with intermittent episodes of "blanking out" lasting for few seconds, had some episodes of syncope and was found to have heart block and he had undergone pacemaker placement.   Given history of Hospice prior to admission secondary to IPF- Idiopathic pulmonary fibrosis, need to clarify with the patient's family goals/ wishes. With the change of events regarding his mental status and hypotension, would NOT want to be placed on life support. Therefore, family were in agreement to pursue DNR status.  Will attempt to follow-up and see patient if further THN-CM needs identified.  Primary care provider's office is listed as providing transition of care (TOC) follow-up.   For additional questions please contact:  Karin Golden A. Wynette Jersey, BSN, RN-BC [facility] PRIMARY CARE Navigator Cell: [phone]

## 2018-05-24 NOTE — Consult Note (Signed)
NAME:  Dale Winters, MRN:  [id number], DOB:  1940-12-14, LOS: 2 ADMISSION DATE:  05/22/2018, CONSULTATION DATE:  05/24/2018  REFERRING MD:  Ms. [name], CHIEF COMPLAINT:  Seizure   Brief History   This is a 77 year old male with a past medical history of pulmonary fibrosis, was on hospice at home, developed to complete heart block, rescinded hospice designation, admitted for CHB and status post pacemaker placement by EP.  This morning developed seizure x2 on the floor.  Patient given Keppra.  Decision was made to transfer the intensive care unit.  Critical care was consulted.  Past Medical History   Past Medical History:  Diagnosis Date  . Abnormal ECG   . Appendicitis    1956  . CHF (congestive heart failure) (HCC)   . Complete heart block (HCC) 04/2018  . HTN (hypertension)    Significant Hospital Events   10/30 - Seizure X2   Consults: date of consult/date signed off & final recs:  EP - primary  Neurology - 10/30 CCM - 10/30  Procedures (surgical and bedside):  10/29 PMP implant   Significant Diagnostic Tests:  ECHO - EF 55-60, Grade 1 DD   Micro Data:  None   Antimicrobials:  None    Subjective:  Seizure X 2, on floor this AM following PMP yesterday for CHB.   Objective   Blood pressure (!) 62/0, pulse (!) 125, temperature 98.3 F (36.8 C), temperature source Oral, resp. rate (!) 35, height 5\' 10"  (1.778 m), weight 60.5 kg, SpO2 95 %.        Intake/Output Summary (Last 24 hours) at 05/24/2018 1239 Last data filed at 05/24/2018 0600 Gross per 24 hour  Intake 1290.65 ml  Output 275 ml  Net 1015.65 ml   Filed Weights   05/22/18 2315 05/23/18 0500 05/24/18 0418  Weight: 60.4 kg 60.4 kg 60.5 kg    Examination: General appearance: 77 y.o., male, ill appearing, confused  Eyes: anicteric sclerae, moist conjunctivae HENT: NCAT; oropharynx, MMM Neck: Trachea midline; +JVD  Lungs: basilar crackles, diminished BL, no wheeze  CV: RRR, s1 s2, no obvious murmur,  distant heart tones  Abdomen: Soft, non-tender; non-distended, BS present  Extremities: No peripheral edema, radial and DP pulses present bilaterally  Skin: Normal temperature, turgor and texture; no rash Psych: confused  Neuro: confused, post-ictal  Resolved Hospital Problem list     Assessment & Plan:   Acute hypotension, shock, stat bedside echo with large pericardial effusion concerning for tamponade, obstructive shock Concern for PMP lead displacement or RV puncture. Called interventional cardiology, stat to bedside Patient taken to Cath Lab for urgent pericardiocentesis. This was all discussed with family prior to intervention. Will cross and type Trend hemoglobin every 4 hours  Seizure, now post ictal Status post Keppra dosing per neurology EEG pending  Acute on chronic hypoxemic respiratory failure secondary to pulmonary fibrosis Patient with history of cotton mill working as well as prior heavy black dust exposures Was on hospice prior to PMP placement Patient was on hospice at home for his chronic respiratory failure Maintain O2 sats greater than 88%  Complete heart block status post BMP placement on 05/23/2018  DNR After family meeting and discussion with the wife as well as daughters and additional family present the decision was made for no escalation in care meeting no intubation and mechanical ventilation and no CPR  Disposition / Summary of Today's Plan 05/24/18   ICU level care, status post pericardiocentesis    Diet: N.p.o. Pain/Anxiety/Delirium  protocol (if indicated): Not applicable VAP protocol (if indicated): Applicable DVT prophylaxis: Holding GI prophylaxis: PPI Hyperglycemia protocol: None Mobility: Bedrest Code Status: DNR Family Communication: Updated family  Labs   CBC: Recent Labs  Lab 05/19/18 1608 05/22/18 1659 05/22/18 2019 05/23/18 0704  WBC 7.4 7.1 6.5 6.2  NEUTROABS 5.8  --   --   --   HGB 14.0 14.9 15.6 14.0  HCT 43.0  47.0 49.7 44.2  MCV 94.1 94.0 93.6 93.1  PLT 187 264 234 209    Basic Metabolic Panel: Recent Labs  Lab 05/19/18 1608 05/22/18 1659 05/22/18 2019 05/23/18 0704  NA 142 140  --  142  K 3.9 4.2  --  4.0  CL 107 101  --  103  CO2 28 29  --  32  GLUCOSE 100* 95  --  94  BUN 7* 9  --  7*  CREATININE 0.84 0.72 0.74 0.86  CALCIUM 9.1 9.6  --  9.6  MG  --   --  2.3  --    GFR: Estimated Creatinine Clearance: 60.6 mL/min (by C-G formula based on SCr of 0.86 mg/dL). Recent Labs  Lab 05/19/18 1608 05/22/18 1659 05/22/18 2019 05/23/18 0704  WBC 7.4 7.1 6.5 6.2    Liver Function Tests: Recent Labs  Lab 05/19/18 1608 05/22/18 2019  AST 46* 32  ALT 15 19  ALKPHOS 82 104  BILITOT 1.0 0.8  PROT 6.6 8.2*  ALBUMIN 3.4* 4.2   No results for input(s): LIPASE, AMYLASE in the last 168 hours. No results for input(s): AMMONIA in the last 168 hours.  ABG No results found for: PHART, PCO2ART, PO2ART, HCO3, TCO2, ACIDBASEDEF, O2SAT   Coagulation Profile: Recent Labs  Lab 05/22/18 2019  INR 1.04    Cardiac Enzymes: Recent Labs  Lab 05/22/18 2019 05/23/18 0032 05/23/18 0704  TROPONINI <0.03 <0.03 <0.03    HbA1C: Hgb A1c MFr Bld  Date/Time Value Ref Range Status  05/22/2018 08:28 PM 4.5 (L) 4.8 - 5.6 % Final    Comment:    (NOTE) Pre diabetes:          5.7%-6.4% Diabetes:              >6.4% Glycemic control for   <7.0% adults with diabetes     CBG: Recent Labs  Lab 05/24/18 1111  GLUCAP 174*    Admitting History of Present Illness.   Please see above  Review of Systems:   Unable to be obtained secondary to postictal state and critical illness  Past Medical History  He,  has a past medical history of Abnormal ECG, Appendicitis, CHF (congestive heart failure) (HCC), Complete heart block (HCC) (04/2018), and HTN (hypertension).   Surgical History    Past Surgical History:  Procedure Laterality Date  . APPENDECTOMY    . PACEMAKER IMPLANT N/A  05/23/2018   Procedure: PACEMAKER IMPLANT;  Surgeon: Marinus Maw, MD;  Location: [facility] INVASIVE CV LAB;  Service: Cardiovascular;  Laterality: N/A;     Social History   Social History   Socioeconomic History  . Marital status: Married    Spouse name: Not on file  . Number of children: Not on file  . Years of education: Not on file  . Highest education level: Not on file  Occupational History  . Not on file  Social Needs  . Financial resource strain: Not on file  . Food insecurity:    Worry: Not on file    Inability: Not on  file  . Transportation needs:    Medical: Not on file    Non-medical: Not on file  Tobacco Use  . Smoking status: Former Smoker    Packs/day: 0.70    Years: 5.00    Pack years: 3.50    Types: Cigarettes  . Smokeless tobacco: Current User    Types: Chew  Substance and Sexual Activity  . Alcohol use: No    Alcohol/week: 0.0 standard drinks  . Drug use: No  . Sexual activity: Not on file  Lifestyle  . Physical activity:    Days per week: Not on file    Minutes per session: Not on file  . Stress: Not on file  Relationships  . Social connections:    Talks on phone: Not on file    Gets together: Not on file    Attends religious service: Not on file    Active member of club or organization: Not on file    Attends meetings of clubs or organizations: Not on file    Relationship status: Not on file  . Intimate partner violence:    Fear of current or ex partner: Not on file    Emotionally abused: Not on file    Physically abused: Not on file    Forced sexual activity: Not on file  Other Topics Concern  . Not on file  Social History Narrative  . Not on file  ,  reports that he has quit smoking. His smoking use included cigarettes. He has a 3.50 pack-year smoking history. His smokeless tobacco use includes chew. He reports that he does not drink alcohol or use drugs.   Family History   His family history includes Kidney failure in his sister;  Stroke in his mother.   Allergies Allergies  Allergen Reactions  . Ace Inhibitors Cough     Home Medications  Prior to Admission medications   Medication Sig Start Date End Date Taking? Authorizing Provider  albuterol (PROVENTIL HFA;VENTOLIN HFA) 108 (90 Base) MCG/ACT inhaler Inhale 1 puff into the lungs every 6 (six) hours as needed for wheezing or shortness of breath.   Yes [provider]  aspirin EC 81 MG tablet Take 81 mg by mouth at bedtime.   Yes [provider]  fluticasone furoate-vilanterol (BREO ELLIPTA) 200-25 MCG/INH AEPB Inhale 1 puff into the lungs daily.   Yes [provider]  loratadine (CLARITIN) 10 MG tablet Take 10 mg by mouth daily.   Yes [provider]  mirtazapine (REMERON) 15 MG tablet Take 15 mg by mouth at bedtime.    Yes [provider]  Multiple Vitamin (MULTIVITAMIN WITH MINERALS) TABS tablet Take 1 tablet by mouth daily.   Yes [provider]  omeprazole (PRILOSEC OTC) 20 MG tablet Take 20 mg by mouth daily as needed (stomach acid).   Yes [provider]  OXYGEN Inhale 2-4 L into the lungs continuous.   Yes [provider]  Probiotic Product (PROBIOTIC PO) Take 1 tablet by mouth daily.   Yes [provider]  Psyllium (METAMUCIL PO) Take 1 Scoop by mouth at bedtime.   Yes [provider]  vitamin B-12 (CYANOCOBALAMIN) 500 MCG tablet Take 500 mcg by mouth daily.    Yes [provider]    This patient is critically ill with multiple organ system failure; which, requires frequent high complexity decision making, assessment, support, evaluation, and titration of therapies. This was completed through the application of advanced monitoring technologies and extensive interpretation of  multiple databases. During this encounter critical care time was devoted to patient care services described in this note for 45 minutes.  Josephine Igo, DO [facility] 05/24/2018 12:39 PM  Personal pager: [phone] If unanswered, please page CCM On-call: #[phone]

## 2018-05-24 NOTE — Progress Notes (Signed)
Evening rounding note  Pt with seizure activity this morning followed by large pericardial effusion subsequently requiring pericardial drain placement.  He is chronically quite ill.  Currently appears more stable.  No acute indication for lead revision. Pacemaker measurements are stable.   Continue to follow output from pericardial drain overnight.  If no further bleeding, could consider avoiding lead revision given his advanced medical illness, active seizures, and fragility.  Ultimately, a more conservative approach may be advised.  Dr Ladona Ridgel to re-evaluate in AM. EP following closely.   Hillis Range MD, [facility] 05/24/2018    .

## 2018-05-24 NOTE — Plan of Care (Signed)

## 2018-05-24 NOTE — Consult Note (Signed)
Neurology Consultation Reason for Consult: Seizures Referring Physician: Rosette Reveal  CC: Seizures  History is obtained from: Family  HPI: Dale Winters is a 77 y.o. male with a history of progressive memory loss over the past year per the family who presents with intermittent episodes of "blanking out" the last for only a few seconds.  He also has had some episodes of syncope.  In evaluation for this he was found to have heart block and has undergone pacemaker placement.     In addition to the above described episodes, he has also had a single episode concerning for seizure with clonic activity.  His wife reports this happened multiple months ago but she is not sure exactly how long it has been.  He was doing well postoperatively from the pacemaker placement until this morning when he had a tonic-clonic seizure.  Initially he was postictal, but gradually improved to the point where he was back to his normal self per the family.  I actually saw him during this timeframe, and ordered Keppra.  While the Keppra was being started he had a second seizure, but this was prior to the Keppra infusion.  Over the past year, he has had significant impairments in short-term memory.  His wife states that " he is about to drive me crazy" by asking the same questions over and over again and repeating himself.  There has been some worsening over the past year.  ROS: Unable to obtain due to altered mental status.   Past Medical History:  Diagnosis Date  . Abnormal ECG   . Appendicitis    1956  . CHF (congestive heart failure) (HCC)   . Complete heart block (HCC) 04/2018  . HTN (hypertension)      Family History  Problem Relation Age of Onset  . Stroke Mother   . Kidney failure Sister      Social History:  reports that he has quit smoking. His smoking use included cigarettes. He has a 3.50 pack-year smoking history. His smokeless tobacco use includes chew. He reports that he does not drink alcohol or  use drugs.   Exam: Current vital signs: BP 125/75 (BP Location: Right Arm)   Pulse 72   Temp 98.3 F (36.8 C) (Oral)   Resp (!) 21   Ht 5\' 10"  (1.778 m)   Wt 60.5 kg   SpO2 100%   BMI 19.14 kg/m  Vital signs in last 24 hours: Temp:  [97.6 F (36.4 C)-98.3 F (36.8 C)] 98.3 F (36.8 C) (10/30 0418) Pulse Rate:  [0-194] 72 (10/30 0003) Resp:  [0-47] 21 (10/30 0003) BP: (109-156)/(56-102) 125/75 (10/30 0418) SpO2:  [0 %-100 %] 100 % (10/30 0921) Weight:  [60.5 kg] 60.5 kg (10/30 0418)   Physical Exam  Constitutional: Appears elderly Psych: Does not speak Eyes: No scleral injection HENT: No OP obstrucion Head: Normocephalic.  Cardiovascular: Normal rate and regular rhythm.  Respiratory: Effort normal, non-labored breathing GI: Soft.  No distension. There is no tenderness.  Skin: WDI  Neuro: Mental Status: Patient is awake, he engages the examiner but does not follow commands or answer questions. Cranial Nerves: II: He blink to threat bilaterally. Pupils are unequal, with the right larger than the left, both are reactive to light III,IV, VI: EOMI without ptosis or diploplia.  V: Facial sensation is symmetric to temperature VII: Facial movement is symmetric.  VIII: hearing is intact to voice X: Uvula elevates symmetrically XI: Shoulder shrug is symmetric. XII: tongue is midline  without atrophy or fasciculations.  Motor: Though he does not cooperate with formal strength testing, he does move all of his extremities purposefully. Sensory: He responds to noxious stimulation in all 4 extremities Cerebellar: Does not perform   I have reviewed labs in epic and the results pertinent to this consultation are: BMP-unremarkable  Impression: 77 year old male with recurrent seizures in the postoperative setting.  From the description per the family I strongly suspect that he has some degree of dementia which is a significant risk factor for seizures.  Though I would  typically recommend an MRI, I do think a CT would be adequate given that he cannot have an MRI for 6 weeks after his pacemaker placement.  As for why now, any physiological stress can lower seizure threshold including postoperative state.  Recommendations: 1) CT head 2) EEG 3) Keppra 1 g load followed by 500 mg twice daily 4) if he continues to have seizures, I would give 1 mg of IV Ativan to calm things down as long as he is not hypotensive.  Ritta Slot, MD [facility] [phone]  If 7pm- 7am, please page neurology on call as listed in AMION.

## 2018-05-24 NOTE — Significant Event (Signed)
Rapid Response Event Note  Overview: Time Called: 1106 Arrival Time: 1108 Event Type: Neurologic, Respiratory, Cardiac, Hypotension  Initial Focused Assessment: Patient with 2nd seizure this morning.  Upon my arrival he appears post ictal.  BP 90/40  HR 122  RR 28-34  O2 sat 100% on NRB Dr Amada Jupiter at bedside to assess patient,  Keppra infusing  Interventions: 500 cc NS bolus started. Attending paged. BP decreased to 60s/40s  HR 130s Manual BP correlated with automatic BP Patient occasionally restless  Continued NS bolus Renee PA at bedside   CCM consulted  Plan transfer to ICU Patient pale but warm Complaint of CP Awake, occasionally answering questions Total 1.5L NS bolus given Transferred to 2H Echo done Tamponade: urgently transported to cath lab   Plan of Care (if not transferred):  Event Summary: Name of Physician Notified: Casandra Doffing (EP) at    Name of Consulting Physician Notified: Dr Amada Jupiter at 1106  Outcome: Transferred (Comment)     Marcellina Millin

## 2018-05-24 NOTE — Progress Notes (Signed)
Pt unavailable for EEG at this time. Pt is in Cath Lab will attempt later when schedule permits

## 2018-05-24 NOTE — Progress Notes (Signed)
Bedside EEG completed, results pending. 

## 2018-05-24 NOTE — Procedures (Signed)
History: 77 year old male with new onset seizures  Sedation: None  Technique: This is a 21 channel routine scalp EEG performed at the bedside with bipolar and monopolar montages arranged in accordance to the international 10/20 system of electrode placement. One channel was dedicated to EKG recording.    Background: The background consists of intermixed alpha and beta activities. There is a well defined posterior dominant rhythm of 8 Hz that attenuates with eye opening.  In addition there is mild generalized irregular delta activity.  Sleep is not recorded.   Photic stimulation: Physiologic driving is not performed  EEG Abnormalities: 1) mild generalized irregular delta activity  Clinical Interpretation: This EEG is consistent with a mild generalized nonspecific cerebral dysfunction (encephalopathy). There was no seizure or seizure predisposition recorded on this study. Please note that a normal EEG does not preclude the possibility of epilepsy.   Ritta Slot, MD [facility] [phone]  If 7pm- 7am, please page neurology on call as listed in AMION.

## 2018-05-24 NOTE — Progress Notes (Signed)
  Echocardiogram 2D Echocardiogram limited effusion has been performed.  Leta Jungling M 05/24/2018, 1:00 PM

## 2018-05-24 NOTE — Progress Notes (Signed)
  Echocardiogram 2D Echocardiogram limited  has been performed.  Leta Jungling M 05/24/2018, 2:21 PM

## 2018-05-25 ENCOUNTER — Inpatient Hospital Stay (HOSPITAL_COMMUNITY): Payer: Medicare Other

## 2018-05-25 ENCOUNTER — Encounter (HOSPITAL_COMMUNITY): Payer: Self-pay | Admitting: Cardiovascular Disease

## 2018-05-25 DIAGNOSIS — I314 Cardiac tamponade: Secondary | ICD-10-CM

## 2018-05-25 DIAGNOSIS — R569 Unspecified convulsions: Secondary | ICD-10-CM

## 2018-05-25 DIAGNOSIS — J849 Interstitial pulmonary disease, unspecified: Secondary | ICD-10-CM

## 2018-05-25 DIAGNOSIS — I313 Pericardial effusion (noninflammatory): Secondary | ICD-10-CM

## 2018-05-25 LAB — CBC WITH DIFFERENTIAL/PLATELET
ABS IMMATURE GRANULOCYTES: 0.1 10*3/uL — AB (ref 0.00–0.07)
Abs Immature Granulocytes: 0.14 10*3/uL — ABNORMAL HIGH (ref 0.00–0.07)
Abs Immature Granulocytes: 0.17 10*3/uL — ABNORMAL HIGH (ref 0.00–0.07)
Abs Immature Granulocytes: 0.1 10*3/uL — ABNORMAL HIGH (ref 0.00–0.07)
BASOS ABS: 0 10*3/uL (ref 0.0–0.1)
BASOS PCT: 0 %
BASOS PCT: 0 %
BASOS PCT: 0 %
Basophils Absolute: 0 10*3/uL (ref 0.0–0.1)
Basophils Absolute: 0 10*3/uL (ref 0.0–0.1)
Basophils Absolute: 0 10*3/uL (ref 0.0–0.1)
Basophils Relative: 0 %
EOS ABS: 0 10*3/uL (ref 0.0–0.5)
EOS ABS: 0.1 10*3/uL (ref 0.0–0.5)
EOS ABS: 0.1 10*3/uL (ref 0.0–0.5)
EOS PCT: 0 %
EOS PCT: 0 %
EOS PCT: 1 %
Eosinophils Absolute: 0 10*3/uL (ref 0.0–0.5)
Eosinophils Relative: 0 %
HCT: 40.4 % (ref 39.0–52.0)
HCT: 39.9 % (ref 39.0–52.0)
HCT: 39.1 % (ref 39.0–52.0)
HEMATOCRIT: 38.2 % — AB (ref 39.0–52.0)
HEMOGLOBIN: 12.8 g/dL — AB (ref 13.0–17.0)
Hemoglobin: 13.3 g/dL (ref 13.0–17.0)
Hemoglobin: 13.2 g/dL (ref 13.0–17.0)
Hemoglobin: 12.6 g/dL — ABNORMAL LOW (ref 13.0–17.0)
IMMATURE GRANULOCYTES: 1 %
Immature Granulocytes: 1 %
Immature Granulocytes: 1 %
Immature Granulocytes: 1 %
LYMPHS ABS: 0.5 10*3/uL — AB (ref 0.7–4.0)
LYMPHS ABS: 0.5 10*3/uL — AB (ref 0.7–4.0)
LYMPHS ABS: 0.6 10*3/uL — AB (ref 0.7–4.0)
LYMPHS ABS: 0.8 10*3/uL (ref 0.7–4.0)
LYMPHS PCT: 3 %
Lymphocytes Relative: 5 %
Lymphocytes Relative: 3 %
Lymphocytes Relative: 4 %
MCH: 29.9 pg (ref 26.0–34.0)
MCH: 30.2 pg (ref 26.0–34.0)
MCH: 30.6 pg (ref 26.0–34.0)
MCH: 30.8 pg (ref 26.0–34.0)
MCHC: 32.2 g/dL (ref 30.0–36.0)
MCHC: 32.9 g/dL (ref 30.0–36.0)
MCHC: 33.1 g/dL (ref 30.0–36.0)
MCHC: 33.5 g/dL (ref 30.0–36.0)
MCV: 91.4 fL (ref 80.0–100.0)
MCV: 91.8 fL (ref 80.0–100.0)
MCV: 92.9 fL (ref 80.0–100.0)
MCV: 93 fL (ref 80.0–100.0)
MONOS PCT: 10 %
MONOS PCT: 11 %
MONOS PCT: 9 %
Monocytes Absolute: 1.7 10*3/uL — ABNORMAL HIGH (ref 0.1–1.0)
Monocytes Absolute: 1.5 10*3/uL — ABNORMAL HIGH (ref 0.1–1.0)
Monocytes Absolute: 1.9 10*3/uL — ABNORMAL HIGH (ref 0.1–1.0)
Monocytes Absolute: 1.4 10*3/uL — ABNORMAL HIGH (ref 0.1–1.0)
Monocytes Relative: 9 %
NEUTROS ABS: 16 10*3/uL — AB (ref 1.7–7.7)
NEUTROS ABS: 14.3 10*3/uL — AB (ref 1.7–7.7)
NEUTROS PCT: 87 %
NEUTROS PCT: 85 %
Neutro Abs: 15 10*3/uL — ABNORMAL HIGH (ref 1.7–7.7)
Neutro Abs: 12.7 10*3/uL — ABNORMAL HIGH (ref 1.7–7.7)
Neutrophils Relative %: 85 %
Neutrophils Relative %: 84 %
PLATELETS: 155 10*3/uL (ref 150–400)
PLATELETS: 160 10*3/uL (ref 150–400)
Platelets: 157 10*3/uL (ref 150–400)
Platelets: 161 10*3/uL (ref 150–400)
RBC: 4.18 MIL/uL — ABNORMAL LOW (ref 4.22–5.81)
RBC: 4.4 MIL/uL (ref 4.22–5.81)
RBC: 4.29 MIL/uL (ref 4.22–5.81)
RBC: 4.21 MIL/uL — ABNORMAL LOW (ref 4.22–5.81)
RDW: 12.4 % (ref 11.5–15.5)
RDW: 12.6 % (ref 11.5–15.5)
RDW: 12.6 % (ref 11.5–15.5)
RDW: 12.4 % (ref 11.5–15.5)
WBC: 18.4 10*3/uL — ABNORMAL HIGH (ref 4.0–10.5)
WBC: 16.8 10*3/uL — ABNORMAL HIGH (ref 4.0–10.5)
WBC: 17.6 10*3/uL — ABNORMAL HIGH (ref 4.0–10.5)
WBC: 14.9 10*3/uL — ABNORMAL HIGH (ref 4.0–10.5)
nRBC: 0 % (ref 0.0–0.2)
nRBC: 0 % (ref 0.0–0.2)
nRBC: 0 % (ref 0.0–0.2)
nRBC: 0 % (ref 0.0–0.2)

## 2018-05-25 LAB — ECHOCARDIOGRAM LIMITED
HEIGHTINCHES: 70 in
WEIGHTICAEL: 2331.58 [oz_av]

## 2018-05-25 MED ORDER — DILTIAZEM HCL-DEXTROSE 100-5 MG/100ML-% IV SOLN (PREMIX)
5.0000 mg/h | INTRAVENOUS | Status: DC
  Administered 2018-05-25: 5 mg/h via INTRAVENOUS
  Administered 2018-05-26 (×2): 10 mg/h via INTRAVENOUS
  Filled 2018-05-25 (×3): qty 100

## 2018-05-25 MED ORDER — SODIUM CHLORIDE 0.9 % IV BOLUS
250.0000 mL | Freq: Once | INTRAVENOUS | Status: AC
  Administered 2018-05-25: 250 mL via INTRAVENOUS

## 2018-05-25 NOTE — Progress Notes (Signed)
  Echocardiogram 2D Echocardiogram has been performed.  Dale Winters 05/25/2018, 12:37 PM

## 2018-05-25 NOTE — Progress Notes (Signed)
Pt. Without urine output since 0700 this morning. Denies urge to void. Bladder scan revealed 100cc of urine in bladder. KVO IV fluids and minimal intake of fluids today. Will continue to closely monitor.

## 2018-05-25 NOTE — Progress Notes (Addendum)
NAME:  Dale Winters, MRN:  [id number], DOB:  1941-07-18, LOS: 3 ADMISSION DATE:  05/22/2018, CONSULTATION DATE:  05/25/2018  REFERRING MD:  Ms. [name], CHIEF COMPLAINT:  Seizure   Brief History   This is a 77 year old male with a past medical history of pulmonary fibrosis, was on hospice at home, developed to complete heart block, rescinded hospice designation, admitted for CHB and status post pacemaker placement by EP.  This morning developed seizure x2 on the floor.  Patient given Keppra.  Decision was made to transfer the intensive care unit.  Critical care was consulted.  Past Medical History   Past Medical History:  Diagnosis Date  . Abnormal ECG   . Appendicitis    1956  . CHF (congestive heart failure) (HCC)   . Complete heart block (HCC) 04/2018  . HTN (hypertension)    Significant Hospital Events   10/30 - Seizure X2   Consults: date of consult/date signed off & final recs:  EP - primary  Neurology - 10/30 CCM - 10/30  Procedures (surgical and bedside):  10/29 PMP implant   Significant Diagnostic Tests:  ECHO - EF 55-60, Grade 1 DD   Micro Data:  None   Antimicrobials:  None    Subjective:  Seizure X 2, on floor this AM following PMP yesterday for CHB.   Objective   Blood pressure 113/61, pulse 95, temperature (!) 100.5 F (38.1 C), temperature source Axillary, resp. rate 18, height 5\' 10"  (1.778 m), weight 66.1 kg, SpO2 99 %.        Intake/Output Summary (Last 24 hours) at 05/25/2018 1003 Last data filed at 05/25/2018 0800 Gross per 24 hour  Intake 539.33 ml  Output 415 ml  Net 124.33 ml   Filed Weights   05/23/18 0500 05/24/18 0418 05/25/18 0400  Weight: 60.4 kg 60.5 kg 66.1 kg    Examination: General: Well-nourished well-developed male who is hard of hearing but in no acute distress HEENT: JVD lymphadenopathy is appreciated Neuro: Awake alert follows commands conversant wants to go home CV: Sounds are regular paced beat monitor pacer pocket  suture line left subclavicular area unremarkable PULM: even/non-labored, lungs bilaterally diminished bilaterally WU:JWJX, non-tender, bsx4 active  Extremities: warm/dry, negative edema  Skin: no rashes or lesions  05/25/2018 chest x-ray ray reviewed by me chronic lung disease noted.  Tracheal deviation noted.  Pericardial drain in place no acute disease process Resolved Hospital Problem list     Assessment & Plan:   Acute hypotension, shock, stat bedside echo with large pericardial effusion concerning for tamponade, obstructive shock Currently resolved Pericardial drain remains in place scant drainage Pericardial drain removal per cardiology  Seizure, now post ictal Keppra dose  Acute on chronic hypoxemic respiratory failure secondary to pulmonary fibrosis Currently with nasal cannula Sats 96% Wean O2 as tolerated  Complete heart block status post BMP placement on 05/23/2018  DNR After family meeting and discussion with the wife as well as daughters and additional family present the decision was made for no escalation in care meeting no intubation and mechanical ventilation and no CPR.  05/25/2018 remains DNR  Disposition / Summary of Today's Plan 05/25/18   ICU level care, status post pericardiocentesis    Diet: Advance as tolerated Pain/Anxiety/Delirium protocol (if indicated): Not applicable VAP protocol (if indicated): Applicable DVT prophylaxis: Holding GI prophylaxis: PPI Hyperglycemia protocol: None Mobility: Bedrest Code Status: DNR Family Communication: 05/25/2018 patient and family updated bedside  Labs   CBC: Recent Labs  Lab 05/19/18  1608  05/23/18 0704 05/24/18 1526 05/24/18 2146 05/25/18 0054 05/25/18 0622  WBC 7.4   < > 6.2 14.7* 17.8* 18.4* 16.8*  NEUTROABS 5.8  --   --   --  15.9* 16.0* 14.3*  HGB 14.0   < > 14.0 13.9 12.8* 12.8* 13.3  HCT 43.0   < > 44.2 43.2 38.1* 38.2* 40.4  MCV 94.1   < > 93.1 93.9 91.4 91.4 91.8  PLT 187   < > 209 183  160 157 155   < > = values in this interval not displayed.    Basic Metabolic Panel: Recent Labs  Lab 05/19/18 1608 05/22/18 1659 05/22/18 2019 05/23/18 0704 05/24/18 1526  NA 142 140  --  142 138  K 3.9 4.2  --  4.0 4.1  CL 107 101  --  103 107  CO2 28 29  --  32 19*  GLUCOSE 100* 95  --  94 156*  BUN 7* 9  --  7* 12  CREATININE 0.84 0.72 0.74 0.86 1.27*  CALCIUM 9.1 9.6  --  9.6 9.0  MG  --   --  2.3  --   --    GFR: Estimated Creatinine Clearance: 44.8 mL/min (A) (by C-G formula based on SCr of 1.27 mg/dL (H)). Recent Labs  Lab 05/24/18 1526 05/24/18 2146 05/25/18 0054 05/25/18 0622  WBC 14.7* 17.8* 18.4* 16.8*    Liver Function Tests: Recent Labs  Lab 05/19/18 1608 05/22/18 2019  AST 46* 32  ALT 15 19  ALKPHOS 82 104  BILITOT 1.0 0.8  PROT 6.6 8.2*  ALBUMIN 3.4* 4.2   No results for input(s): LIPASE, AMYLASE in the last 168 hours. No results for input(s): AMMONIA in the last 168 hours.  ABG No results found for: PHART, PCO2ART, PO2ART, HCO3, TCO2, ACIDBASEDEF, O2SAT   Coagulation Profile: Recent Labs  Lab 05/22/18 2019  INR 1.04    Cardiac Enzymes: Recent Labs  Lab 05/22/18 2019 05/23/18 0032 05/23/18 0704  TROPONINI <0.03 <0.03 <0.03    HbA1C: Hgb A1c MFr Bld  Date/Time Value Ref Range Status  05/22/2018 08:28 PM 4.5 (L) 4.8 - 5.6 % Final    Comment:    (NOTE) Pre diabetes:          5.7%-6.4% Diabetes:              >6.4% Glycemic control for   <7.0% adults with diabetes     CBG: Recent Labs  Lab 05/24/18 1111  GLUCAP 174*    Admitting History of Present Illness.   Please see above  Review of Systems:   Unable to be obtained secondary to postictal state and critical illness  Past Medical History  He,  has a past medical history of Abnormal ECG, Appendicitis, CHF (congestive heart failure) (HCC), Complete heart block (HCC) (04/2018), and HTN (hypertension).   Surgical History    Past Surgical History:  Procedure  Laterality Date  . APPENDECTOMY    . PACEMAKER IMPLANT N/A 05/23/2018   Procedure: PACEMAKER IMPLANT;  Surgeon: Marinus Maw, MD;  Location: [facility] INVASIVE CV LAB;  Service: Cardiovascular;  Laterality: N/A;  . PERICARDIOCENTESIS N/A 05/24/2018   Procedure: PERICARDIOCENTESIS;  Surgeon: Iran Ouch, MD;  Location: [facility] INVASIVE CV LAB;  Service: Cardiovascular;  Laterality: N/A;     Social History   Social History   Socioeconomic History  . Marital status: Married    Spouse name: Not on file  . Number of children: Not on  file  . Years of education: Not on file  . Highest education level: Not on file  Occupational History  . Not on file  Social Needs  . Financial resource strain: Not on file  . Food insecurity:    Worry: Not on file    Inability: Not on file  . Transportation needs:    Medical: Not on file    Non-medical: Not on file  Tobacco Use  . Smoking status: Former Smoker    Packs/day: 0.70    Years: 5.00    Pack years: 3.50    Types: Cigarettes  . Smokeless tobacco: Current User    Types: Chew  Substance and Sexual Activity  . Alcohol use: No    Alcohol/week: 0.0 standard drinks  . Drug use: No  . Sexual activity: Not on file  Lifestyle  . Physical activity:    Days per week: Not on file    Minutes per session: Not on file  . Stress: Not on file  Relationships  . Social connections:    Talks on phone: Not on file    Gets together: Not on file    Attends religious service: Not on file    Active member of club or organization: Not on file    Attends meetings of clubs or organizations: Not on file    Relationship status: Not on file  . Intimate partner violence:    Fear of current or ex partner: Not on file    Emotionally abused: Not on file    Physically abused: Not on file    Forced sexual activity: Not on file  Other Topics Concern  . Not on file  Social History Narrative  . Not on file  ,  reports that he has quit smoking. His smoking use  included cigarettes. He has a 3.50 pack-year smoking history. His smokeless tobacco use includes chew. He reports that he does not drink alcohol or use drugs.   Family History   His family history includes Kidney failure in his sister; Stroke in his mother.   Allergies Allergies  Allergen Reactions  . Ace Inhibitors Cough     Home Medications  Prior to Admission medications   Medication Sig Start Date End Date Taking? Authorizing Provider  albuterol (PROVENTIL HFA;VENTOLIN HFA) 108 (90 Base) MCG/ACT inhaler Inhale 1 puff into the lungs every 6 (six) hours as needed for wheezing or shortness of breath.   Yes [provider]  aspirin EC 81 MG tablet Take 81 mg by mouth at bedtime.   Yes [provider]  fluticasone furoate-vilanterol (BREO ELLIPTA) 200-25 MCG/INH AEPB Inhale 1 puff into the lungs daily.   Yes [provider]  loratadine (CLARITIN) 10 MG tablet Take 10 mg by mouth daily.   Yes [provider]  mirtazapine (REMERON) 15 MG tablet Take 15 mg by mouth at bedtime.    Yes [provider]  Multiple Vitamin (MULTIVITAMIN WITH MINERALS) TABS tablet Take 1 tablet by mouth daily.   Yes [provider]  omeprazole (PRILOSEC OTC) 20 MG tablet Take 20 mg by mouth daily as needed (stomach acid).   Yes [provider]  OXYGEN Inhale 2-4 L into the lungs continuous.   Yes [provider]  Probiotic Product (PROBIOTIC PO) Take 1 tablet by mouth daily.   Yes [provider]  Psyllium (METAMUCIL PO) Take 1 Scoop by mouth at bedtime.   Yes [provider]  vitamin B-12 (CYANOCOBALAMIN) 500 MCG tablet Take  500 mcg by mouth daily.    Yes [provider]     Devra Dopp ACNP [facility] PCCM Pager [phone] till 1 pm If no answer page [phone] 05/25/2018, 10:03 AM

## 2018-05-25 NOTE — Progress Notes (Signed)
Saw patient today. Has pericardial drain in place due to pericardial effusion after pacemaker implant and seizure. Pericardial drain with minimal drainiage overnight. Drain removed with minimal discomfort. Dale Winters plan for TTE with drain out.  [name], MD 05/25/2018 9:40 AM

## 2018-05-25 NOTE — Progress Notes (Signed)
Progress Note  Patient Name: Dale Winters Date of Encounter: 05/25/2018  Primary Cardiologist: Dale Balsam, MD   Subjective   Some pleuritic pain.   Inpatient Medications    Scheduled Meds: . aspirin EC  81 mg Oral Daily  . atorvastatin  20 mg Oral q1800  . fluticasone furoate-vilanterol  1 puff Inhalation Daily  . levETIRAcetam  500 mg Oral BID  . loratadine  10 mg Oral Daily  . mouth rinse  15 mL Mouth Rinse BID  . mirtazapine  15 mg Oral QHS  . multivitamin with minerals  1 tablet Oral Daily  . pantoprazole  40 mg Oral Daily  . sodium chloride flush  5 mL Intracatheter Q8H  . vitamin B-12  1,000 mcg Oral Daily   Continuous Infusions: . sodium chloride 10 mL/hr at 05/25/18 0700   PRN Meds: acetaminophen, albuterol, nitroGLYCERIN, ondansetron (ZOFRAN) IV, psyllium   Vital Signs    Vitals:   05/25/18 0500 05/25/18 0600 05/25/18 0700 05/25/18 0803  BP: 108/68 119/69 113/67   Pulse: 93 92 88   Resp: (!) 27 (!) 29 (!) 27   Temp:    (!) 100.5 F (38.1 C)  TempSrc:    Axillary  SpO2: 94% 99% 100%   Weight:      Height:        Intake/Output Summary (Last 24 hours) at 05/25/2018 5993 Last data filed at 05/25/2018 0700 Gross per 24 hour  Intake 529.33 ml  Output 415 ml  Net 114.33 ml   Filed Weights   05/23/18 0500 05/24/18 0418 05/25/18 0400  Weight: 60.4 kg 60.5 kg 66.1 kg    Telemetry    nsr - Personally Reviewed  ECG    nsr - Personally Reviewed  Physical Exam   GEN: chronically ill appearing, no acute distress.   Neck: 6 cm JVD Cardiac: RRR, no murmurs, rubs, or gallops.  Respiratory: scattered fine rales bilaterally. GI: Soft, nontender, non-distended  MS: No edema; No deformity. Neuro:  Nonfocal  Psych: Normal affect   Labs    Chemistry Recent Labs  Lab 05/19/18 1608 05/22/18 1659 05/22/18 2019 05/23/18 0704 05/24/18 1526  NA 142 140  --  142 138  K 3.9 4.2  --  4.0 4.1  CL 107 101  --  103 107  CO2 28 29  --  32 19*   GLUCOSE 100* 95  --  94 156*  BUN 7* 9  --  7* 12  CREATININE 0.84 0.72 0.74 0.86 1.27*  CALCIUM 9.1 9.6  --  9.6 9.0  PROT 6.6  --  8.2*  --   --   ALBUMIN 3.4*  --  4.2  --   --   AST 46*  --  32  --   --   ALT 15  --  19  --   --   ALKPHOS 82  --  104  --   --   BILITOT 1.0  --  0.8  --   --   GFRNONAA >60 >60 >60 >60 52*  GFRAA >60 >60 >60 >60 >60  ANIONGAP 7 10  --  7 12     Hematology Recent Labs  Lab 05/24/18 2146 05/25/18 0054 05/25/18 0622  WBC 17.8* 18.4* 16.8*  RBC 4.17* 4.18* 4.40  HGB 12.8* 12.8* 13.3  HCT 38.1* 38.2* 40.4  MCV 91.4 91.4 91.8  MCH 30.7 30.6 30.2  MCHC 33.6 33.5 32.9  RDW 12.4 12.4 12.4  PLT 160  157 155    Cardiac Enzymes Recent Labs  Lab 05/22/18 2019 05/23/18 0032 05/23/18 0704  TROPONINI <0.03 <0.03 <0.03    Recent Labs  Lab 05/19/18 1618 05/22/18 1709  TROPIPOC 0.00 0.00     BNPNo results for input(s): BNP, PROBNP in the last 168 hours.   DDimer No results for input(s): DDIMER in the last 168 hours.   Radiology    Dg Chest 2 View  Result Date: 05/24/2018 CLINICAL DATA:  Pacer placement. EXAM: CHEST - 2 VIEW COMPARISON:  05/22/2018. FINDINGS: There is a left chest wall pacer device with leads in the right atrial appendage and right ventricle. No pneumothorax identified. Unchanged cardiac enlargement. Extensive calcified mediastinal adenopathy and hilar adenopathy is again noted. Advanced interstitial lung disease is identified compatible with pulmonary fibrosis. IMPRESSION: 1. Status post left chest wall pacer placement. No pneumothorax identified. Electronically Signed   By: Signa Kell M.D.   On: 05/24/2018 10:37    Cardiac Studies   Echo - reviewed  Patient Profile     77 y.o. male admitted with stokes adams syncope, LBBB, s/p PPM who developed recurrent seizures post op, then developed chest pain and hypotension after the seizures, found to have pericardia effusion, despite normal PPM function, s/p drainage.  Overnight minimal drainage.  Assessment & Plan    1. 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 Conduction on his own today. 2. Seizures - appreciate Dr. Alene Mires help. He has had no more seizures since getting Keppra. Now on oral Keppra and will be discharged home on it. 3. Pericardial effusion - he is s/p drainage. No drainage overnight. We will remove the drain this morning and repeat the echo. 4. PPM - his leads have been working normally. We will recheck this morning. If no recurrent accumulation of fluid, chest pain resolves with drain removal and leads are working ok, we will watch him. If any of above occurs, he will need a lead re-positioning.  Dale Winters,M.D.  For questions or updates, please contact [facility] Please consult [email or web address] for contact info under Cardiology/STEMI.      Signed, Dale Bunting, MD  05/25/2018, 8:22 AM  Patient ID: Dale Winters, male   DOB: 04-08-1941, 77 y.o.   MRN: [id number]

## 2018-05-25 NOTE — Progress Notes (Signed)
Pt went into Afib RVR at 22:25, rate 140s-180s. Asymptomatic. No response to vagal maneuvers. BP decreased but stable, 90s/50s(MAP~65). Cards fellow notified, given orders for Cardizem infusion to titrate for a HR <120.  Will continue to monitor.   Peyton Bottoms, RN 05/25/18

## 2018-05-25 NOTE — Progress Notes (Signed)
Patient visited, he feels well, asks when he can go home.  He denies any kind of CP at this time.  No overt SOB beyond his known baseline.  Tele is SR 90's, BP has been stable this morning. Dressing at drain site is dry Echo completed and reviewed by Drs. Nishan and Taylor, small pericardial effusion, .  Discussed device check this AM (reported to me verbally by rep) with Dr.Taylor threshold stable P wave sensing lower to 0.3-0.9/sensitivity adjusted to 0.15  Will give 250cc fluid in d/w Dr.Taylor No plans for lead revision today Bedside look echo tomorrow  Continue ICU

## 2018-05-25 NOTE — Progress Notes (Signed)
Subjective: No further seizures since keppra was started.   Exam: Vitals:   05/25/18 0600 05/25/18 0700  BP: 119/69 113/67  Pulse: 92 88  Resp: (!) 29 (!) 27  Temp:    SpO2: 99% 100%   Gen: In bed, NAD Resp: non-labored breathing, no acute distress Abd: soft, nt  Neuro: MS: Awake, alert, interactive. Gives month as June, year as 2010 CN:VFF, R pupil slightly larger than L Motor: MAEW Sensory:intact to LT  Pertinent Labs: WBC 18  Impression: 77 yo M with second and third in his life seizures yesterday. I strongly suspect that he has some underlying dementia as an etiology for these seizures.   Recommendations: 1) CT head once able 2) Continue keppra 500mg  BID   Ritta Slot, MD [facility] [phone]  If 7pm- 7am, please page neurology on call as listed in AMION.

## 2018-05-26 ENCOUNTER — Inpatient Hospital Stay (HOSPITAL_COMMUNITY): Payer: Medicare Other

## 2018-05-26 ENCOUNTER — Other Ambulatory Visit (HOSPITAL_COMMUNITY): Payer: PRIVATE HEALTH INSURANCE

## 2018-05-26 LAB — BASIC METABOLIC PANEL
Anion gap: 7 (ref 5–15)
BUN: 16 mg/dL (ref 8–23)
CALCIUM: 8.9 mg/dL (ref 8.9–10.3)
CO2: 25 mmol/L (ref 22–32)
Chloride: 104 mmol/L (ref 98–111)
Creatinine, Ser: 1.03 mg/dL (ref 0.61–1.24)
Glucose, Bld: 133 mg/dL — ABNORMAL HIGH (ref 70–99)
POTASSIUM: 4 mmol/L (ref 3.5–5.1)
SODIUM: 136 mmol/L (ref 135–145)

## 2018-05-26 LAB — PHOSPHORUS: PHOSPHORUS: 1.9 mg/dL — AB (ref 2.5–4.6)

## 2018-05-26 LAB — MAGNESIUM: MAGNESIUM: 2.1 mg/dL (ref 1.7–2.4)

## 2018-05-26 MED ORDER — POTASSIUM PHOSPHATES 15 MMOLE/5ML IV SOLN
10.0000 mmol | Freq: Once | INTRAVENOUS | Status: AC
  Administered 2018-05-26: 10 mmol via INTRAVENOUS
  Filled 2018-05-26: qty 3.33

## 2018-05-26 MED ORDER — QUETIAPINE FUMARATE 100 MG PO TABS
100.0000 mg | ORAL_TABLET | Freq: Every day | ORAL | Status: DC
  Filled 2018-05-26: qty 1

## 2018-05-26 MED ORDER — NICOTINE 14 MG/24HR TD PT24
14.0000 mg | MEDICATED_PATCH | Freq: Every day | TRANSDERMAL | Status: DC
  Administered 2018-05-26 – 2018-05-28 (×3): 14 mg via TRANSDERMAL
  Filled 2018-05-26 (×3): qty 1

## 2018-05-26 MED ORDER — QUETIAPINE FUMARATE 25 MG PO TABS
100.0000 mg | ORAL_TABLET | Freq: Every evening | ORAL | Status: DC | PRN
  Administered 2018-05-26: 100 mg via ORAL

## 2018-05-26 MED ORDER — DILTIAZEM LOAD VIA INFUSION
20.0000 mg | Freq: Once | INTRAVENOUS | Status: AC
  Administered 2018-05-26: 20 mg via INTRAVENOUS
  Filled 2018-05-26: qty 20

## 2018-05-26 MED ORDER — DILTIAZEM LOAD VIA INFUSION: 30.0000 mg | Freq: Once | INTRAVENOUS | Status: DC

## 2018-05-26 MED ORDER — SODIUM CHLORIDE 0.9 % IV BOLUS
250.0000 mL | Freq: Once | INTRAVENOUS | Status: AC
  Administered 2018-05-26: 250 mL via INTRAVENOUS

## 2018-05-26 NOTE — Progress Notes (Signed)
eLink Physician-Brief Progress Note Patient Name: Dale Winters DOB: 1941-02-27 MRN: [id number]   Date of Service  05/26/2018  HPI/Events of Note  Agitation and unable to sleep despite Remeron.  eICU Interventions  Seroquel 100 mg ordered for tonight.     Intervention Category Major Interventions: Delirium, psychosis, severe agitation - evaluation and management  Rosalie Gums Kelicia Youtz 05/26/2018, 3:10 AM

## 2018-05-26 NOTE — Progress Notes (Signed)
Pt exhibiting increased agitation, pulling at lines and dressings, disorientation. Likely delirium r/t interrupted sleep tonight d/t other issues. Pola Corn MD notified, MD to place orders for one time dose seroquel.

## 2018-05-26 NOTE — Progress Notes (Signed)
Progress Note  Patient Name: Dale Winters Date of Encounter: 05/26/2018  Primary Cardiologist: Gypsy Balsam, MD   Subjective   Confusion. He denies any pain.   Inpatient Medications    Scheduled Meds: . aspirin EC  81 mg Oral Daily  . atorvastatin  20 mg Oral q1800  . fluticasone furoate-vilanterol  1 puff Inhalation Daily  . levETIRAcetam  500 mg Oral BID  . loratadine  10 mg Oral Daily  . mouth rinse  15 mL Mouth Rinse BID  . mirtazapine  15 mg Oral QHS  . multivitamin with minerals  1 tablet Oral Daily  . pantoprazole  40 mg Oral Daily  . sodium chloride flush  5 mL Intracatheter Q8H  . vitamin B-12  1,000 mcg Oral Daily   Continuous Infusions: . sodium chloride 10 mL/hr at 05/26/18 0700  . diltiazem (CARDIZEM) infusion 10 mg/hr (05/26/18 0700)  . potassium PHOSPHATE IVPB (in mmol) 10 mmol (05/26/18 0800)   PRN Meds: acetaminophen, albuterol, nitroGLYCERIN, ondansetron (ZOFRAN) IV, psyllium, QUEtiapine   Vital Signs    Vitals:   05/26/18 0615 05/26/18 0630 05/26/18 0645 05/26/18 0700  BP:    (!) 87/70  Pulse: (!) 105 92 91 92  Resp: (!) 30 (!) 26 (!) 27 (!) 27  Temp:      TempSrc:      SpO2: 99% 100% 100% 100%  Weight:      Height:        Intake/Output Summary (Last 24 hours) at 05/26/2018 0822 Last data filed at 05/26/2018 0700 Gross per 24 hour  Intake 974.98 ml  Output 475 ml  Net 499.98 ml   Filed Weights   05/23/18 0500 05/24/18 0418 05/25/18 0400  Weight: 60.4 kg 60.5 kg 66.1 kg    Telemetry    Atrial fib, now NSR - Personally Reviewed  ECG    none - Personally Reviewed  Physical Exam   GEN: No acute distress.   Neck: No JVD Cardiac: RRR, no murmurs, rubs, or gallops.  Respiratory: Clear to auscultation bilaterally. GI: Soft, nontender, non-distended  MS: No edema; No deformity. Neuro:  Nonfocal  Psych: Normal affect   Labs    Chemistry Recent Labs  Lab 05/19/18 1608  05/22/18 2019 05/23/18 0704 05/24/18 1526  05/26/18 0314  NA 142   < >  --  142 138 136  K 3.9   < >  --  4.0 4.1 4.0  CL 107   < >  --  103 107 104  CO2 28   < >  --  32 19* 25  GLUCOSE 100*   < >  --  94 156* 133*  BUN 7*   < >  --  7* 12 16  CREATININE 0.84   < > 0.74 0.86 1.27* 1.03  CALCIUM 9.1   < >  --  9.6 9.0 8.9  PROT 6.6  --  8.2*  --   --   --   ALBUMIN 3.4*  --  4.2  --   --   --   AST 46*  --  32  --   --   --   ALT 15  --  19  --   --   --   ALKPHOS 82  --  104  --   --   --   BILITOT 1.0  --  0.8  --   --   --   GFRNONAA >60   < > >60 >60  52* >60  GFRAA >60   < > >60 >60 >60 >60  ANIONGAP 7   < >  --  7 12 7    < > = values in this interval not displayed.     Hematology Recent Labs  Lab 05/25/18 0622 05/25/18 1056 05/25/18 1419  WBC 16.8* 17.6* 14.9*  RBC 4.40 4.29 4.21*  HGB 13.3 13.2 12.6*  HCT 40.4 39.9 39.1  MCV 91.8 93.0 92.9  MCH 30.2 30.8 29.9  MCHC 32.9 33.1 32.2  RDW 12.4 12.6 12.6  PLT 155 160 161    Cardiac Enzymes Recent Labs  Lab 05/22/18 2019 05/23/18 0032 05/23/18 0704  TROPONINI <0.03 <0.03 <0.03    Recent Labs  Lab 05/19/18 1618 05/22/18 1709  TROPIPOC 0.00 0.00     BNPNo results for input(s): BNP, PROBNP in the last 168 hours.   DDimer No results for input(s): DDIMER in the last 168 hours.   Radiology    Ct Head Wo Contrast  Result Date: 05/25/2018 CLINICAL DATA:  Seizures. EXAM: CT HEAD WITHOUT CONTRAST TECHNIQUE: Contiguous axial images were obtained from the base of the skull through the vertex without intravenous contrast. COMPARISON:  None. FINDINGS: Brain: Mild diffuse cortical atrophy is noted. Mild chronic ischemic white matter disease is noted. No mass effect or midline shift is noted. Ventricular size is within normal limits. There is no evidence of mass lesion, hemorrhage or acute infarction. Vascular: No hyperdense vessel or unexpected calcification. Skull: Normal. Negative for fracture or focal lesion. Sinuses/Orbits: Small right maxillary mucous  retention cyst is noted. Other: Fluid is noted in the left mastoid air cells. IMPRESSION: Mild diffuse cortical atrophy. Mild chronic ischemic white matter disease. No acute intracranial abnormality seen. Electronically Signed   By: Lupita Raider, M.D.   On: 05/25/2018 16:57   Dg Chest Port 1 View  Result Date: 05/25/2018 CLINICAL DATA:  Short of breath today.  Pacemaker complications. EXAM: PORTABLE CHEST 1 VIEW COMPARISON:  05/24/2018 and older exams FINDINGS: Left anterior chest wall pacemaker and its leads superior unchanged from the previous day's exam. Cardiac silhouette is top-normal in size. Extensive calcified mediastinal and hilar lymph nodes are stable. No mediastinal or hilar masses. Lungs show coarsely thickened interstitial markings. There is denser scarring at the apices. No evidence of pneumonia or pulmonary edema. No pleural effusion or pneumothorax. There is a catheter extending from the abdomen, tip projecting over the left heart border, new since the prior exam. IMPRESSION: 1. New catheter, presumably placed femorally, has its tip projecting over the left heart border. 2. No change in the left anterior chest wall pacemaker or position of its leads. 3. No acute cardiopulmonary disease. 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 Electronically Signed   By: Amie Portland M.D.   On: 05/25/2018 09:10    Cardiac Studies   none  Patient Profile     77 y.o. male admitted with stokes adams attacks, s/p PPM complicated by seizure followed by pericardial effusion after seizure.   Assessment & Plan    1. CHB - he is pacing today.  2. PAF - he went into this last night and has been treated with IV cardizem and is back in NSR 3. Pericardial effusion - he has a small effusion on my bed side echo this morning which did not look any bigger to me from the echo's he had post tap and yesterday.  4. Atrial lead - I suspect he has had a microperforation surrounding his seizures.  His sensing was decreased yesterday but his other numbers were good. If sensing ok today, watchful waiting but if numbers have worsened, I would reposition. His CXR does not show any evidence of lead movement. 5. Seizures - appreciate neuro help. He has had none since starting Keppra.  Nakeshia Waldeck,M.D.  For questions or updates, please contact [facility] Please consult [email or web address] for contact info under Cardiology/STEMI.      Signed, Lewayne Bunting, MD  05/26/2018, 8:22 AM  Patient ID: Alma Friendly, male   DOB: 17-Mar-1941, 77 y.o.   MRN: [id number]

## 2018-05-26 NOTE — Progress Notes (Signed)
Pt's HR still 140s-160s despite starting and titrating up Cardizem. Cards fellow notified, ordered 20mg  bolus of Cardizem with a 250cc NS bolus.   Administered, pt's HR came down to 100s afib. BP stable. Will continue to monitor.

## 2018-05-26 NOTE — Progress Notes (Signed)
Subjective: No further seizures since keppra was started.   Exam: Vitals:   05/26/18 0814 05/26/18 0900  BP:  (!) 96/57  Pulse:  89  Resp:  (!) 26  Temp: 98.3 F (36.8 C)   SpO2:  100%   Gen: In bed, NAD Resp: non-labored breathing, no acute distress Abd: soft, nt  Neuro: MS: Awake, alert, interactive. Gives month as June, year as 2010 CN:VFF, R pupil slightly larger than L Motor: MAEW Sensory:intact to LT  CT hea d- no acute findings or source of seizure.   Impression: 77 yo M with second and third in his life seizures yesterday. I strongly suspect that he has some underlying dementia as an etiology for these seizures. He is controlled, but has developed some delirium which I suspect is multifactorial.   Recommendations: 1) Continue keppra 500mg  BID 2) Agree with seroquel QHS 3) Please call with further questions or concerns.   Ritta Slot, MD [facility] [phone]  If 7pm- 7am, please page neurology on call as listed in AMION.

## 2018-05-27 ENCOUNTER — Inpatient Hospital Stay (HOSPITAL_COMMUNITY): Payer: Medicare Other

## 2018-05-27 DIAGNOSIS — I313 Pericardial effusion (noninflammatory): Secondary | ICD-10-CM

## 2018-05-27 LAB — ECHOCARDIOGRAM LIMITED
Height: 70 in
Weight: 2123.47 oz

## 2018-05-27 MED ORDER — METOPROLOL TARTRATE 12.5 MG HALF TABLET
12.5000 mg | ORAL_TABLET | Freq: Once | ORAL | Status: AC
  Administered 2018-05-28: 12.5 mg via ORAL
  Filled 2018-05-27: qty 1

## 2018-05-27 MED ORDER — METOPROLOL TARTRATE 25 MG PO TABS: 25.0000 mg | ORAL_TABLET | Freq: Once | ORAL | Status: DC

## 2018-05-27 NOTE — Progress Notes (Signed)
Afib, hr 109 noted on monitor.  Patient denies chest pain but does complain of pleuritic pain on inspiration.  This complaint was noted on prior progress notes and the patient states that it is not like the pain he experienced with his pericardial effusion.  12 lead EKG requested for confirmation and cardiologist on call paged.

## 2018-05-27 NOTE — Progress Notes (Signed)
  Echocardiogram 2D Echocardiogram has been performed.  Dari Carpenito G Temitope Flammer 05/27/2018, 9:49 AM

## 2018-05-27 NOTE — Progress Notes (Signed)
Progress Note  Patient Name: Dale Winters Date of Encounter: 05/27/2018  Primary Cardiologist: Gypsy Balsam, MD   Subjective   Feels well today without compliant. Was up to a chair for 2 hours yesterday. Ready to go home.   Inpatient Medications    Scheduled Meds: . aspirin EC  81 mg Oral Daily  . atorvastatin  20 mg Oral q1800  . fluticasone furoate-vilanterol  1 puff Inhalation Daily  . levETIRAcetam  500 mg Oral BID  . loratadine  10 mg Oral Daily  . mouth rinse  15 mL Mouth Rinse BID  . mirtazapine  15 mg Oral QHS  . multivitamin with minerals  1 tablet Oral Daily  . nicotine  14 mg Transdermal Daily  . pantoprazole  40 mg Oral Daily  . sodium chloride flush  5 mL Intracatheter Q8H  . vitamin B-12  1,000 mcg Oral Daily   Continuous Infusions: . sodium chloride 10 mL/hr at 05/26/18 1900   PRN Meds: acetaminophen, albuterol, nitroGLYCERIN, ondansetron (ZOFRAN) IV, psyllium, QUEtiapine   Vital Signs    Vitals:   05/27/18 0500 05/27/18 0600 05/27/18 0700 05/27/18 0800  BP: 104/60 96/64 103/62 109/73  Pulse: 86 82 86 93  Resp: (!) 22 (!) 24 (!) 23 20  Temp:      TempSrc:      SpO2: 100% 100% 100% 92%  Weight: 60.2 kg     Height:        Intake/Output Summary (Last 24 hours) at 05/27/2018 0934 Last data filed at 05/27/2018 0900 Gross per 24 hour  Intake 471.41 ml  Output 1075 ml  Net -603.59 ml   Filed Weights   05/24/18 0418 05/25/18 0400 05/27/18 0500  Weight: 60.5 kg 66.1 kg 60.2 kg    Telemetry    A sense, Vpaced - Personally Reviewed  ECG    none- Personally Reviewed  Physical Exam   GEN: Well nourished, well developed, in no acute distress  HEENT: normal  Neck: no JVD, carotid bruits, or masses Cardiac: RRR; no murmurs, rubs, or gallops,no edema  Respiratory:  clear to auscultation bilaterally, normal work of breathing GI: soft, nontender, nondistended, + BS MS: no deformity or atrophy  Skin: warm and dry, device site well  healed Neuro:  Strength and sensation are intact Psych: euthymic mood, full affect   Labs    Chemistry Recent Labs  Lab 05/22/18 2019 05/23/18 0704 05/24/18 1526 05/26/18 0314  NA  --  142 138 136  K  --  4.0 4.1 4.0  CL  --  103 107 104  CO2  --  32 19* 25  GLUCOSE  --  94 156* 133*  BUN  --  7* 12 16  CREATININE 0.74 0.86 1.27* 1.03  CALCIUM  --  9.6 9.0 8.9  PROT 8.2*  --   --   --   ALBUMIN 4.2  --   --   --   AST 32  --   --   --   ALT 19  --   --   --   ALKPHOS 104  --   --   --   BILITOT 0.8  --   --   --   GFRNONAA >60 >60 52* >60  GFRAA >60 >60 >60 >60  ANIONGAP  --  7 12 7      Hematology Recent Labs  Lab 05/25/18 0622 05/25/18 1056 05/25/18 1419  WBC 16.8* 17.6* 14.9*  RBC 4.40 4.29 4.21*  HGB  13.3 13.2 12.6*  HCT 40.4 39.9 39.1  MCV 91.8 93.0 92.9  MCH 30.2 30.8 29.9  MCHC 32.9 33.1 32.2  RDW 12.4 12.6 12.6  PLT 155 160 161    Cardiac Enzymes Recent Labs  Lab 05/22/18 2019 05/23/18 0032 05/23/18 0704  TROPONINI <0.03 <0.03 <0.03    Recent Labs  Lab 05/22/18 1709  TROPIPOC 0.00     BNPNo results for input(s): BNP, PROBNP in the last 168 hours.   DDimer No results for input(s): DDIMER in the last 168 hours.   Radiology    Ct Head Wo Contrast  Result Date: 05/25/2018 CLINICAL DATA:  Seizures. EXAM: CT HEAD WITHOUT CONTRAST TECHNIQUE: Contiguous axial images were obtained from the base of the skull through the vertex without intravenous contrast. COMPARISON:  None. FINDINGS: Brain: Mild diffuse cortical atrophy is noted. Mild chronic ischemic white matter disease is noted. No mass effect or midline shift is noted. Ventricular size is within normal limits. There is no evidence of mass lesion, hemorrhage or acute infarction. Vascular: No hyperdense vessel or unexpected calcification. Skull: Normal. Negative for fracture or focal lesion. Sinuses/Orbits: Small right maxillary mucous retention cyst is noted. Other: Fluid is noted in the left  mastoid air cells. IMPRESSION: Mild diffuse cortical atrophy. Mild chronic ischemic white matter disease. No acute intracranial abnormality seen. Electronically Signed   By: Lupita Raider, M.D.   On: 05/25/2018 16:57   Dg Chest Port 1 View  Result Date: 05/26/2018 CLINICAL DATA:  Pericardial effusion EXAM: PORTABLE CHEST 1 VIEW COMPARISON:  Portable exam 0526 hours compared 05/25/2018 FINDINGS: LEFT subclavian pacemaker leads project over RIGHT atrium and RIGHT ventricle. Interval removal of infra diaphragmatic Swan-Ganz catheter. Enlargement of cardiac silhouette with slight pulmonary vascular congestion. Extensive calcified mediastinal and hilar adenopathy. Chronic interstitial lung disease/fibrosis changes greatest at the mid to lower lungs. Calcified pulmonary granulomata. No definite acute infiltrate, pleural effusion or pneumothorax. Bones demineralized. IMPRESSION: Enlargement of cardiac silhouette. Extensive old granulomatous disease changes. Pulmonary fibrosis. No definite acute abnormalities. Electronically Signed   By: Ulyses Southward M.D.   On: 05/26/2018 08:55    Cardiac Studies   none  Patient Profile     77 y.o. male admitted with stokes adams attacks, s/p PPM complicated by seizure followed by pericardial effusion after seizure.   Assessment & Plan    1. CHB - V pacing today  2. PAF - remains in sinus rhythm 3. Pericardial effusion - Repeat echo pending. Major Santerre likely move to the floor if effusion stable. Lema Heinkel have PT/OT work with him today 4. Atrial lead - Likely microperforation surrouding seizures. Sensing decreaed but still acceptable. Other numbers good. No changes. 5. Seizures - on Keppra. Appreciate neuro recs    For questions or updates, please contact [facility] Please consult [email or web address] for contact info under Cardiology/STEMI.      Signed, Ryah Cribb Jorja Loa, MD  05/27/2018, 9:34 AM

## 2018-05-28 ENCOUNTER — Encounter (HOSPITAL_COMMUNITY): Payer: Self-pay | Admitting: Physician Assistant

## 2018-05-28 DIAGNOSIS — I480 Paroxysmal atrial fibrillation: Secondary | ICD-10-CM

## 2018-05-28 LAB — GLUCOSE, CAPILLARY
GLUCOSE-CAPILLARY: 89 mg/dL (ref 70–99)
GLUCOSE-CAPILLARY: 106 mg/dL — AB (ref 70–99)

## 2018-05-28 MED ORDER — QUETIAPINE FUMARATE 100 MG PO TABS: 100.0000 mg | ORAL_TABLET | Freq: Every evening | ORAL | 0 refills | Status: DC | PRN

## 2018-05-28 MED ORDER — LEVETIRACETAM 500 MG PO TABS: 500.0000 mg | ORAL_TABLET | Freq: Two times a day (BID) | ORAL | 1 refills | Status: DC

## 2018-05-28 MED ORDER — PSYLLIUM 58.6 % PO PACK: 1.0000 | PACK | Freq: Every day | ORAL | Status: DC | PRN

## 2018-05-28 NOTE — Discharge Summary (Addendum)
Discharge Summary    Patient ID: [name],  MRN: [id number], DOB/AGE: [birth year] 77 y.o.  Admit date: 05/22/2018 Discharge date: 05/28/2018  Primary Care Provider: Luna Kitchens A Primary Cardiologist: Gypsy Balsam, MD Primary Electrophysiologist:  None  Discharge Diagnoses    Principal Problem:   CHB (complete heart block) (HCC) Active Problems:   HTN (hypertension)   IPF (idiopathic pulmonary fibrosis) (HCC)   Left bundle branch block   Cardiac tamponade   Seizures (HCC)   ILD (interstitial lung disease) (HCC)   PAF (paroxysmal atrial fibrillation) (HCC)  Diagnostic Studies/Procedures    1. Numerous echocardiograms as summarized in chart below, see EMR. 2. Medtronic PPM implantation 10/29/9. _____________     History of Present Illness     Dale Winters is a 77 y.o. male with history of hypertension, idiopathic pulmonary fibrosis with chronic respiratory failure on home O2 and hospice, chronic LBBB, prior dilated cardiomyopathy (EF 40-50% previously, most recently stable), transient CHB with syncope who was admitted to the hospital on 10/28 for eval for PPM. He receives hospice care at home. He was seen at his PCP on the 25th of Oct when he had syncope. EKG showed CHB. EMS transferred to [facility].  In ER he was in SR with 1:1 conduction. Case was discussed with on-call cardiology and he was instructed to have 30 day event monitor and follow up as outpt. On 10/28 he saw Dr. Bing Matter noting numerous episodes of near syncope, also with orthostatic component. Dr. Bing Matter sent him back to the ER with consideration for PPM. It was noted that ER EKG from 10/25 had shown RBBB ventricular escape whereas he has a LBBB at baseline. With these episodes Dr. Bing Matter sent him to [facility] for further eval and possible PPM.  Hospital Course    2D echo showed EF 55-60%, grade 1 DD, mild MR/TR/PR, cannot assess PASP. TSH was 6.3 but free T4 was normal. Troponins were negative and  otherwise labs unremarkable.   He underwent Medtronic PPM implantation 05/23/18. Post procedurally he did develop seizure activity and was seen by neurology who suspected he had some degree of dementia which is a significant risk factor for seizures. MRI was deferred due to PPM implantation but CT head as done showing mild diffuse atrophy and chronic ischemic white matter changes but no acute intracranial abnormality seen. He was loaded with Keppra. In retrospect the wife had reported a similar episode months ago. While the Keppra was started he had a second seizure but this was prior to infusion. He dropped his sats to 50%s during the seizure and quickly recovered back. Initial device recheck showed stable lead measurements with intact function. However, early that afternoon he developed hypotension with SBP 60s and sinus tach 120s. He was treated with 500cc bolus. He was found to have pericardial effusion and underwent urgent pericardiocentesis. It was suspected the abrupt movement from the seizures caused microperforation related to atrial lead. Sensing was decreased but still acceptable so he was not felt to require lead repositioning at this time. He was followed with serial echoes, most recently yesterday with stable small pericardial effusion. Of note, he did have intermittent paroxysms of atrial fibrillation but converted on his own. Anticoagulation was not acutely pursued given recent effusion, but could be considered if he had recurrent AF when he leaves the hospital. He was seen by PT who recommended no PT follow-up needed but suggested 24-hour supervision assistance. Rolling walker was ordered. A prescription for his Seroquel PRN and  Keppra was provided at neurology's suggestion; advised he see PCP and neurology for further follow-up to discuss continuation. I did see that ambulatory referral to [facility] was placed. EP team arranged f/u with the device clinic on 11/11 and also with Dr. Ladona Ridgel in  3 months. Given complications I also sent a message to our office's scheduling team requesting a TOC follow-up appointment with EP team, and our office Lin Hackmann call the patient with this information. He was advised no driving until cleared by neurology and cardiology. _____________  Discharge Vitals Blood pressure 113/76, pulse (!) 101, temperature 97.8 F (36.6 C), temperature source Oral, resp. rate (!) 22, height 5\' 10"  (1.778 m), weight 59 kg, SpO2 98 %.  Filed Weights   05/25/18 0400 05/27/18 0500 05/28/18 0611  Weight: 66.1 kg 60.2 kg 59 kg    Labs & Radiologic Studies    Basic Metabolic Panel Recent Labs    65/46/50 0314  NA 136  K 4.0  CL 104  CO2 25  GLUCOSE 133*  BUN 16  CREATININE 1.03  CALCIUM 8.9  MG 2.1  PHOS 1.9*   _____________  Dg Chest 2 View  Result Date: 05/24/2018 CLINICAL DATA:  Pacer placement. EXAM: CHEST - 2 VIEW COMPARISON:  05/22/2018. FINDINGS: There is a left chest wall pacer device with leads in the right atrial appendage and right ventricle. No pneumothorax identified. Unchanged cardiac enlargement. Extensive calcified mediastinal adenopathy and hilar adenopathy is again noted. Advanced interstitial lung disease is identified compatible with pulmonary fibrosis. IMPRESSION: 1. Status post left chest wall pacer placement. No pneumothorax identified. Electronically Signed   By: Signa Kell M.D.   On: 05/24/2018 10:37   Dg Chest 2 View  Result Date: 05/22/2018 CLINICAL DATA:  Left bundle-branch heart block. EXAM: CHEST - 2 VIEW COMPARISON:  CT 07/14/2017, CXR 05/19/2018 FINDINGS: Cardiomegaly with mediastinal and hilar calcifications similar to prior with moderate degree of diffuse interstitial fibrosis. No overt pulmonary edema, effusion or pneumothorax. Nonaneurysmal appearing thoracic aorta. Biapical pleuroparenchymal thickening is identified. No acute osseous abnormality. IMPRESSION: Chronic interstitial lung disease with fibrosis. Mediastinal and  hilar calcifications compatible with old granulomatous disease or possibly pneumoconiosis. Stable cardiomegaly. Electronically Signed   By: Tollie Eth M.D.   On: 05/22/2018 17:39   Dg Chest 2 View  Result Date: 05/19/2018 CLINICAL DATA:  77 year old with chest pain. EXAM: CHEST - 2 VIEW COMPARISON:  10/01/2017 FINDINGS: Again noted are extensive calcifications throughout the mediastinum and bilateral hilar regions. Chronic interstitial lung densities compatible with fibrosis and architecture distortion. Heart size is within normal limits and stable. No large pleural effusions. No acute bone abnormality. IMPRESSION: Severe chronic lung changes compatible with fibrosis. No acute chest findings. Electronically Signed   By: Richarda Overlie M.D.   On: 05/19/2018 17:05   Ct Head Wo Contrast  Result Date: 05/25/2018 CLINICAL DATA:  Seizures. EXAM: CT HEAD WITHOUT CONTRAST TECHNIQUE: Contiguous axial images were obtained from the base of the skull through the vertex without intravenous contrast. COMPARISON:  None. FINDINGS: Brain: Mild diffuse cortical atrophy is noted. Mild chronic ischemic white matter disease is noted. No mass effect or midline shift is noted. Ventricular size is within normal limits. There is no evidence of mass lesion, hemorrhage or acute infarction. Vascular: No hyperdense vessel or unexpected calcification. Skull: Normal. Negative for fracture or focal lesion. Sinuses/Orbits: Small right maxillary mucous retention cyst is noted. Other: Fluid is noted in the left mastoid air cells. IMPRESSION: Mild diffuse cortical atrophy. Mild chronic  ischemic white matter disease. No acute intracranial abnormality seen. Electronically Signed   By: Lupita Raider, M.D.   On: 05/25/2018 16:57   Dg Chest Port 1 View  Result Date: 05/26/2018 CLINICAL DATA:  Pericardial effusion EXAM: PORTABLE CHEST 1 VIEW COMPARISON:  Portable exam 0526 hours compared 05/25/2018 FINDINGS: LEFT subclavian pacemaker leads  project over RIGHT atrium and RIGHT ventricle. Interval removal of infra diaphragmatic Swan-Ganz catheter. Enlargement of cardiac silhouette with slight pulmonary vascular congestion. Extensive calcified mediastinal and hilar adenopathy. Chronic interstitial lung disease/fibrosis changes greatest at the mid to lower lungs. Calcified pulmonary granulomata. No definite acute infiltrate, pleural effusion or pneumothorax. Bones demineralized. IMPRESSION: Enlargement of cardiac silhouette. Extensive old granulomatous disease changes. Pulmonary fibrosis. No definite acute abnormalities. Electronically Signed   By: Ulyses Southward M.D.   On: 05/26/2018 08:55   Dg Chest Port 1 View  Result Date: 05/25/2018 CLINICAL DATA:  Short of breath today.  Pacemaker complications. EXAM: PORTABLE CHEST 1 VIEW COMPARISON:  05/24/2018 and older exams FINDINGS: Left anterior chest wall pacemaker and its leads superior unchanged from the previous day's exam. Cardiac silhouette is top-normal in size. Extensive calcified mediastinal and hilar lymph nodes are stable. No mediastinal or hilar masses. Lungs show coarsely thickened interstitial markings. There is denser scarring at the apices. No evidence of pneumonia or pulmonary edema. No pleural effusion or pneumothorax. There is a catheter extending from the abdomen, tip projecting over the left heart border, new since the prior exam. IMPRESSION: 1. New catheter, presumably placed femorally, has its tip projecting over the left heart border. 2. No change in the left anterior chest wall pacemaker or position of its leads. 3. No acute cardiopulmonary disease. 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 Electronically Signed   By: Amie Portland M.D.   On: 05/25/2018 09:10   Disposition   Pt is being discharged home today in stable condition.  Follow-up Plans & Appointments    Follow-up Information    [facility] Office Follow up.   Specialty:   Cardiology Why:  06/05/18 @ 3:00PM for wound check visit with device nurses. The office Garritt Molyneux also call you for an early follow-up appointment. You Lita Flynn also see Dr. Ladona Ridgel in 08/2018. Contact information: [address] [place] [address] [phone]       [facility]. Schedule an appointment as soon as possible for a visit.   Contact information: [address]     [address] [place] [address] [phone]       Lise Auer, MD. Schedule an appointment as soon as possible for a visit in 1 week(s).   Specialty:  Family Medicine Contact information: [address] [place] [address] [phone]          Discharge Instructions    Ambulatory referral to Neurology   Complete by:  As directed    An appointment is requested in approximately: 2 weeks   Diet - low sodium heart healthy   Complete by:  As directed    Discharge instructions   Complete by:  As directed    Please see attached sheet at the end of your After-Visit Summary for instructions on wound care, activity, and bathing.  No driving until cleared by your cardiology and neurology team.  You should discuss continuation of Keppra (levetiracetam) and Seroquel (quetiapine) with your primary care provider and neurologist.   Increase activity slowly   Complete by:  As directed  Discharge Medications   Allergies as of 05/28/2018      Reactions   Ace Inhibitors Cough      Medication List    TAKE these medications   albuterol 108 (90 Base) MCG/ACT inhaler Commonly known as:  PROVENTIL HFA;VENTOLIN HFA Inhale 1 puff into the lungs every 6 (six) hours as needed for wheezing or shortness of breath.   aspirin EC 81 MG tablet Take 81 mg by mouth at bedtime.   BREO ELLIPTA 200-25 MCG/INH Aepb Generic drug:  fluticasone furoate-vilanterol Inhale 1 puff into the lungs daily.   levETIRAcetam 500 MG tablet Commonly known as:   KEPPRA Take 1 tablet (500 mg total) by mouth 2 (two) times daily.   loratadine 10 MG tablet Commonly known as:  CLARITIN Take 10 mg by mouth daily.   mirtazapine 15 MG tablet Commonly known as:  REMERON Take 15 mg by mouth at bedtime.   multivitamin with minerals Tabs tablet Take 1 tablet by mouth daily.   omeprazole 20 MG tablet Commonly known as:  PRILOSEC OTC Take 20 mg by mouth daily as needed (stomach acid).   OXYGEN Inhale 2-4 L into the lungs continuous.   PROBIOTIC PO Take 1 tablet by mouth daily.   psyllium 58.6 % packet Commonly known as:  METAMUCIL Take 1 packet by mouth daily as needed (for constipation). What changed:  Another medication with the same name was removed. Continue taking this medication, and follow the directions you see here.   QUEtiapine 100 MG tablet Commonly known as:  SEROQUEL Take 1 tablet (100 mg total) by mouth at bedtime as needed (insominia).   vitamin B-12 500 MCG tablet Commonly known as:  CYANOCOBALAMIN Take 500 mcg by mouth daily.            Durable Medical Equipment  (From admission, onward)         Start     Ordered   05/28/18 1200  For home use only DME Walker rolling  Once    Question:  Patient needs a walker to treat with the following condition  Answer:  Weakness   05/28/18 1200           Allergies:  Allergies  Allergen Reactions  . Ace Inhibitors Cough   Outstanding Labs/Studies   N/A. Atorvastatin was prescribed as inpatient but unclear why. Lipid profile wnl and not on this med at home.  Duration of Discharge Encounter   Greater than 30 minutes including physician time.  Signed, Tacey Ruiz Dunn PA-C 05/28/2018, 3:35 PM  I have seen and examined this patient with Dale Winters.  Agree with above, note added to reflect my findings.  On exam, RRR, no murmurs, lungs clear.  Patient admitted for pacemaker implant.  The night after implant, had 2 seizures.  Subsequently, he developed a pericardial effusion.   Pericardial effusion was thought to due to atrial lead microperforation.  Pericardial drain was placed due to significant hypotension.  Blood pressure recovered and drain was pulled.  Pericardial effusion remained small throughout his hospitalization.  Worked with PT and was deemed safe for discharge.  Dale Winters discharge on Keppra per neurology.  Dale Winters M. Tmya Wigington MD 05/28/2018 3:47 PM

## 2018-05-28 NOTE — Discharge Instructions (Signed)
° ° °  Supplemental Discharge Instructions for  Pacemaker Patients  Activity No heavy lifting or vigorous activity with your left/right arm for 6 to 8 weeks.  Do not raise your left/right arm above your head for one week.  Gradually raise your affected arm as drawn below.             05/27/18                     05/28/18                    05/29/18                   05/30/18 __  NO DRIVING until cleared by your cardiologist and neurologist given your seizures this admission  WOUND CARE - Keep the wound area clean and dry.  Do not get this area wet for one week. No showers for one week; you may shower on 05/30/18  . - The tape/steri-strips on your wound will fall off; do not pull them off.  No bandage is needed on the site.  DO  NOT apply any creams, oils, or ointments to the wound area. - If you notice any drainage or discharge from the wound, any swelling or bruising at the site, or you develop a fever > 101? F after you are discharged home, call the office at once.  Special Instructions - You are still able to use cellular telephones; use the ear opposite the side where you have your pacemaker/defibrillator.  Avoid carrying your cellular phone near your device. - When traveling through airports, show security personnel your identification card to avoid being screened in the metal detectors.  Ask the security personnel to use the hand wand. - Avoid arc welding equipment, MRI testing (magnetic resonance imaging), TENS units (transcutaneous nerve stimulators).  Call the office for questions about other devices. - Avoid electrical appliances that are in poor condition or are not properly grounded. - Microwave ovens are safe to be near or to operate.

## 2018-05-28 NOTE — Evaluation (Addendum)
Physical Therapy Evaluation Patient Details Name: Dale Winters MRN: [id number] DOB: [birth year] Today's Date: 05/28/2018   History of Present Illness  77 y.o. male with HTN, CHF, idiopathic pulmonary fibrosis, transient CHB with syncope, chronic LBBB and hx dilated CM. He underwent pacemaker placement 05/23/18. Hospitalization complicated by new onset seizures and pericardial effusion.     Clinical Impression  PT eval complete. Pt required min guard assist transfers and ambulation 60 feet with RW. Family present during session and verbalizes understanding of assist level needed. Pt/family educated on energy conservation techniques. Pt/family demonstrate good understanding of pt's ability. Pt verbalizes good self awareness regarding need for rest breaks. Pt to return to hospice care upon discharge. Therefore, no follow up PT services indicated. Pt will need RW for home.   Follow Up Recommendations No PT follow up;Supervision/Assistance - 24 hour    Equipment Recommendations  Rolling walker with 5" wheels    Recommendations for Other Services       Precautions / Restrictions Precautions Precautions: Fall;Other (comment);ICD/Pacemaker Precaution Comments: watch sats Restrictions Weight Bearing Restrictions: Yes LUE Weight Bearing: Non weight bearing      Mobility  Bed Mobility Overal bed mobility: Modified Independent                Transfers Overall transfer level: Needs assistance Equipment used: Rolling walker (2 wheeled) Transfers: Sit to/from [employer] Sit to Stand: Min guard Stand pivot transfers: Min guard       General transfer comment: cues for hand placement, increased time and effort  Ambulation/Gait Ambulation/Gait assistance: Min guard Gait Distance (Feet): 60 Feet Assistive device: Rolling walker (2 wheeled) Gait Pattern/deviations: Step-through pattern;Decreased stride length Gait velocity: decreased Gait velocity interpretation:  <1.31 ft/sec, indicative of household ambulator General Gait Details: Pt ambulated on 4 L O2 with desat to 85%. 4-minute recovery in recliner for return to 91%.  Stairs            Wheelchair Mobility    Modified Rankin (Stroke Patients Only)       Balance Overall balance assessment: Needs assistance Sitting-balance support: No upper extremity supported;Feet supported Sitting balance-Leahy Scale: Fair     Standing balance support: Bilateral upper extremity supported;During functional activity Standing balance-Leahy Scale: Poor Standing balance comment: reliant on RW                             Pertinent Vitals/Pain Pain Assessment: No/denies pain    Home Living Family/patient expects to be discharged to:: Private residence Living Arrangements: Spouse/significant other Available Help at Discharge: Family;Available 24 hours/day Type of Home: House Home Access: Level entry     Home Layout: One level Home Equipment: Walker - 4 wheels;Cane - single point;Bedside commode;Tub bench Additional Comments: home O2, 3 L continuous    Prior Function Level of Independence: Independent with assistive device(s)         Comments: occassional use of AD.     Hand Dominance   Dominant Hand: Left    Extremity/Trunk Assessment   Upper Extremity Assessment Upper Extremity Assessment: Generalized weakness    Lower Extremity Assessment Lower Extremity Assessment: Generalized weakness    Cervical / Trunk Assessment Cervical / Trunk Assessment: Kyphotic  Communication   Communication: HOH  Cognition Arousal/Alertness: Awake/alert Behavior During Therapy: WFL for tasks assessed/performed Overall Cognitive Status: Within Functional Limits for tasks assessed  General Comments General comments (skin integrity, edema, etc.): Wife, daughter, and grandson present during session. Pt/family educated on energy  conservation techniques.     Exercises     Assessment/Plan    PT Assessment Patent does not need any further PT services  PT Problem List         PT Treatment Interventions      PT Goals (Current goals can be found in the Care Plan section)  Acute Rehab PT Goals Patient Stated Goal: home today PT Goal Formulation: All assessment and education complete, DC therapy    Frequency     Barriers to discharge        Co-evaluation               AM-PAC PT "6 Clicks" Daily Activity  Outcome Measure Difficulty turning over in bed (including adjusting bedclothes, sheets and blankets)?: A Little Difficulty moving from lying on back to sitting on the side of the bed? : A Little Difficulty sitting down on and standing up from a chair with arms (e.g., wheelchair, bedside commode, etc,.)?: A Lot Help needed moving to and from a bed to chair (including a wheelchair)?: A Little Help needed walking in hospital room?: A Little Help needed climbing 3-5 steps with a railing? : A Little 6 Click Score: 17    End of Session Equipment Utilized During Treatment: Gait belt;Oxygen Activity Tolerance: Patient tolerated treatment well Patient left: in chair;with call bell/phone within reach;with family/visitor present Nurse Communication: Mobility status PT Visit Diagnosis: Difficulty in walking, not elsewhere classified (R26.2)    Time: 3557-3220 PT Time Calculation (min) (ACUTE ONLY): 41 min   Charges:   PT Evaluation $PT Eval Moderate Complexity: 1 Mod PT Treatments $Gait Training: 23-37 mins        Aida Raider, PT  Office # [phone] Pager [phone]   Ilda Foil 05/28/2018, 12:37 PM

## 2018-05-28 NOTE — Progress Notes (Signed)
Progress Note  Patient Name: Dale Winters Date of Encounter: 05/28/2018  Primary Cardiologist: Gypsy Balsam, MD   Subjective   Well today without complaint.  Ready to go home.  He did go into eat fibrillation overnight with some rapid rates.  He converted on his own.  Inpatient Medications    Scheduled Meds: . aspirin EC  81 mg Oral Daily  . atorvastatin  20 mg Oral q1800  . fluticasone furoate-vilanterol  1 puff Inhalation Daily  . levETIRAcetam  500 mg Oral BID  . loratadine  10 mg Oral Daily  . mouth rinse  15 mL Mouth Rinse BID  . mirtazapine  15 mg Oral QHS  . multivitamin with minerals  1 tablet Oral Daily  . nicotine  14 mg Transdermal Daily  . pantoprazole  40 mg Oral Daily  . sodium chloride flush  5 mL Intracatheter Q8H  . vitamin B-12  1,000 mcg Oral Daily   Continuous Infusions: . sodium chloride Stopped (05/28/18 0500)   PRN Meds: acetaminophen, albuterol, nitroGLYCERIN, ondansetron (ZOFRAN) IV, psyllium, QUEtiapine   Vital Signs    Vitals:   05/27/18 2051 05/28/18 0610 05/28/18 0611 05/28/18 1047  BP: 107/61 114/70  113/76  Pulse: (!) 117 90  (!) 101  Resp: (!) 24 (!) 22    Temp: 97.8 F (36.6 C) 97.8 F (36.6 C)    TempSrc: Oral Oral    SpO2: 95% 97%  98%  Weight:   59 kg   Height:        Intake/Output Summary (Last 24 hours) at 05/28/2018 1052 Last data filed at 05/28/2018 1048 Gross per 24 hour  Intake 517.36 ml  Output 575 ml  Net -57.64 ml   Filed Weights   05/25/18 0400 05/27/18 0500 05/28/18 0611  Weight: 66.1 kg 60.2 kg 59 kg    Telemetry    Atrial sensed, ventricular paced.  Intermittent atrial fibrillation- Personally Reviewed  ECG    none- Personally Reviewed  Physical Exam   GEN: Chronically ill-appearing in no acute distress HEENT: normal  Neck: no JVD, carotid bruits, or masses Cardiac: RRR; no murmurs, rubs, or gallops,no edema  Respiratory:  clear to auscultation bilaterally, normal work of breathing GI:  soft, nontender, nondistended, + BS MS: no deformity or atrophy  Skin: warm and dry, device site well healed Neuro:  Strength and sensation are intact Psych: euthymic mood, full affect    Labs    Chemistry Recent Labs  Lab 05/22/18 2019 05/23/18 0704 05/24/18 1526 05/26/18 0314  NA  --  142 138 136  K  --  4.0 4.1 4.0  CL  --  103 107 104  CO2  --  32 19* 25  GLUCOSE  --  94 156* 133*  BUN  --  7* 12 16  CREATININE 0.74 0.86 1.27* 1.03  CALCIUM  --  9.6 9.0 8.9  PROT 8.2*  --   --   --   ALBUMIN 4.2  --   --   --   AST 32  --   --   --   ALT 19  --   --   --   ALKPHOS 104  --   --   --   BILITOT 0.8  --   --   --   GFRNONAA >60 >60 52* >60  GFRAA >60 >60 >60 >60  ANIONGAP  --  7 12 7      Hematology Recent Labs  Lab 05/25/18 0622 05/25/18  1056 05/25/18 1419  WBC 16.8* 17.6* 14.9*  RBC 4.40 4.29 4.21*  HGB 13.3 13.2 12.6*  HCT 40.4 39.9 39.1  MCV 91.8 93.0 92.9  MCH 30.2 30.8 29.9  MCHC 32.9 33.1 32.2  RDW 12.4 12.6 12.6  PLT 155 160 161    Cardiac Enzymes Recent Labs  Lab 05/22/18 2019 05/23/18 0032 05/23/18 0704  TROPONINI <0.03 <0.03 <0.03    Recent Labs  Lab 05/22/18 1709  TROPIPOC 0.00     BNPNo results for input(s): BNP, PROBNP in the last 168 hours.   DDimer No results for input(s): DDIMER in the last 168 hours.   Radiology    No results found.  Cardiac Studies   none  Patient Profile     77 y.o. male admitted with stokes adams attacks, s/p PPM complicated by seizure followed by pericardial effusion after seizure.   Assessment & Plan    1. CHB -ventricular pacing today 2. PAF -did go into atrial fibrillation overnight but converted on his own.  If he has more atrial fibrillation once he leaves the hospital would likely benefit from anticoagulation. 3. Pericardial effusion -repeat echo with stable effusion.   4. Atrial lead -likely microperforation surrounding seizures.  Sensing decreased but other numbers on device still  acceptable.  No need for lead revision at this time. 5. Seizures -on Keppra.  Appreciate neuro recs  Disposition: Patient ready for discharge today pending PT evaluation.  He does have hospice at home which may be able to provide his therapy to avoid SNF placement.  For questions or updates, please contact [facility] Please consult [email or web address] for contact info under Cardiology/STEMI.      Signed, Brittiney Dicostanzo Jorja Loa, MD  05/28/2018, 10:52 AM

## 2018-05-29 ENCOUNTER — Telehealth: Payer: Self-pay | Admitting: Internal Medicine

## 2018-05-29 NOTE — Telephone Encounter (Signed)
Patient contacted regarding discharge from  [facility] on 05/28/18.  Patient understands to follow up with provider Gypsy Balsam NP on 06/07/18 at 9:20 am at [address] Patient understands discharge instructions? yes Patient understands medications and regiment? yes Patient understands to bring all medications to this visit? yes  Talked to patient's daughter. Patient having constipation this morning. Patient given Magnesium Citrate and got sick after taking and had vomiting. Encouraged patient's daughter to have patient eat a bland diet and if patient's symptoms do not improve to call patient's PCP.

## 2018-05-29 NOTE — Telephone Encounter (Signed)
1 wk TOC fu appt made with Amber 06-07-18 at 920am, offered Tuesday but appt too early

## 2018-05-30 DIAGNOSIS — G40909 Epilepsy, unspecified, not intractable, without status epilepticus: Secondary | ICD-10-CM | POA: Diagnosis not present

## 2018-05-30 DIAGNOSIS — I429 Cardiomyopathy, unspecified: Secondary | ICD-10-CM | POA: Diagnosis not present

## 2018-05-30 DIAGNOSIS — Z950 Presence of cardiac pacemaker: Secondary | ICD-10-CM | POA: Diagnosis not present

## 2018-05-30 DIAGNOSIS — J8410 Pulmonary fibrosis, unspecified: Secondary | ICD-10-CM | POA: Diagnosis not present

## 2018-06-04 DIAGNOSIS — I447 Left bundle-branch block, unspecified: Secondary | ICD-10-CM | POA: Diagnosis not present

## 2018-06-04 DIAGNOSIS — I441 Atrioventricular block, second degree: Secondary | ICD-10-CM | POA: Diagnosis not present

## 2018-06-04 DIAGNOSIS — K219 Gastro-esophageal reflux disease without esophagitis: Secondary | ICD-10-CM | POA: Diagnosis not present

## 2018-06-04 DIAGNOSIS — Z681 Body mass index (BMI) 19 or less, adult: Secondary | ICD-10-CM | POA: Diagnosis not present

## 2018-06-04 DIAGNOSIS — I420 Dilated cardiomyopathy: Secondary | ICD-10-CM | POA: Diagnosis not present

## 2018-06-04 DIAGNOSIS — Z7982 Long term (current) use of aspirin: Secondary | ICD-10-CM | POA: Diagnosis not present

## 2018-06-04 DIAGNOSIS — Z87891 Personal history of nicotine dependence: Secondary | ICD-10-CM | POA: Diagnosis not present

## 2018-06-04 DIAGNOSIS — Z48812 Encounter for surgical aftercare following surgery on the circulatory system: Secondary | ICD-10-CM | POA: Diagnosis not present

## 2018-06-04 DIAGNOSIS — J84112 Idiopathic pulmonary fibrosis: Secondary | ICD-10-CM | POA: Diagnosis not present

## 2018-06-04 DIAGNOSIS — G40909 Epilepsy, unspecified, not intractable, without status epilepticus: Secondary | ICD-10-CM | POA: Diagnosis not present

## 2018-06-04 DIAGNOSIS — I10 Essential (primary) hypertension: Secondary | ICD-10-CM | POA: Diagnosis not present

## 2018-06-04 DIAGNOSIS — I480 Paroxysmal atrial fibrillation: Secondary | ICD-10-CM | POA: Diagnosis not present

## 2018-06-04 DIAGNOSIS — Z950 Presence of cardiac pacemaker: Secondary | ICD-10-CM | POA: Diagnosis not present

## 2018-06-05 ENCOUNTER — Ambulatory Visit (INDEPENDENT_AMBULATORY_CARE_PROVIDER_SITE_OTHER): Payer: Medicare Other | Admitting: *Deleted

## 2018-06-05 DIAGNOSIS — I442 Atrioventricular block, complete: Secondary | ICD-10-CM | POA: Diagnosis not present

## 2018-06-05 DIAGNOSIS — I480 Paroxysmal atrial fibrillation: Secondary | ICD-10-CM

## 2018-06-05 DIAGNOSIS — Z950 Presence of cardiac pacemaker: Secondary | ICD-10-CM | POA: Diagnosis not present

## 2018-06-05 LAB — CUP PACEART INCLINIC DEVICE CHECK
Battery Remaining Longevity: 78 mo
Battery Voltage: 3.19 V
Brady Statistic AP VP Percent: 0.07 %
Brady Statistic AP VS Percent: 0.01 %
Brady Statistic AS VP Percent: 94.67 %
Brady Statistic AS VS Percent: 5.27 %
Implantable Lead Implant Date: 20191029
Implantable Lead Location: 753860
Implantable Lead Model: 5076
Implantable Pulse Generator Implant Date: 20191029
Lead Channel Impedance Value: 266 Ohm
Lead Channel Impedance Value: 513 Ohm
Lead Channel Pacing Threshold Amplitude: 0.5 V
Lead Channel Pacing Threshold Pulse Width: 1 ms
Lead Channel Sensing Intrinsic Amplitude: 1 mV
Lead Channel Sensing Intrinsic Amplitude: 12.25 mV
Lead Channel Setting Pacing Amplitude: 3.5 V
Lead Channel Setting Pacing Pulse Width: 1 ms
MDC IDC LEAD IMPLANT DT: 20191029
MDC IDC LEAD LOCATION: 753859
MDC IDC MSMT LEADCHNL RA IMPEDANCE VALUE: 399 Ohm
MDC IDC MSMT LEADCHNL RA PACING THRESHOLD AMPLITUDE: 0.75 V
MDC IDC MSMT LEADCHNL RA PACING THRESHOLD PULSEWIDTH: 0.4 ms
MDC IDC MSMT LEADCHNL RV IMPEDANCE VALUE: 418 Ohm
MDC IDC SESS DTM: 20191111185312
MDC IDC SET LEADCHNL RA PACING AMPLITUDE: 3.5 V
MDC IDC SET LEADCHNL RV SENSING SENSITIVITY: 1.2 mV
MDC IDC STAT BRADY RA PERCENT PACED: 0.12 %
MDC IDC STAT BRADY RV PERCENT PACED: 86.37 %

## 2018-06-05 NOTE — Progress Notes (Signed)
Wound check appointment. Steri-strips removed. Wound without redness or edema. Incision edges approximated, wound well healed. Normal device function. Thresholds, sensing, and impedances consistent with implant measurements. RV (His) capture appears intermittently septal and non-selective per EKG, though interpretation is difficult due to sinus tach and 1:1 conduction, LOC at 0.25V @ 1.76ms. Device programmed at 3.5V with auto capture on in RA (RV on monitor only) for extra safety margin until 3 month visit. Histogram distribution right-shifted, patient not clearly symptomatic, plan to review at upcoming [facility] appointment with AS. 5 AT/AF episodes (8.9% burden) and 1 fast A&V episode--AF per EGMs, plan to defer OAC due to recent pericardial effusion per hospital d/c notes. No high ventricular rates noted. AT/AF daily burden alert turned on for 24hr burden time, Avg V rate alert turned on at 120bpm. Patient and family educated about wound care, arm mobility, lifting restrictions, and Carelink monitor. ROV with AS on 06/07/18.

## 2018-06-06 ENCOUNTER — Encounter: Payer: Medicare Other | Admitting: Nurse Practitioner

## 2018-06-06 DIAGNOSIS — G40909 Epilepsy, unspecified, not intractable, without status epilepticus: Secondary | ICD-10-CM | POA: Diagnosis not present

## 2018-06-06 DIAGNOSIS — I447 Left bundle-branch block, unspecified: Secondary | ICD-10-CM | POA: Diagnosis not present

## 2018-06-06 DIAGNOSIS — Z48812 Encounter for surgical aftercare following surgery on the circulatory system: Secondary | ICD-10-CM | POA: Diagnosis not present

## 2018-06-06 DIAGNOSIS — J84112 Idiopathic pulmonary fibrosis: Secondary | ICD-10-CM | POA: Diagnosis not present

## 2018-06-06 DIAGNOSIS — I441 Atrioventricular block, second degree: Secondary | ICD-10-CM | POA: Diagnosis not present

## 2018-06-06 DIAGNOSIS — I10 Essential (primary) hypertension: Secondary | ICD-10-CM | POA: Diagnosis not present

## 2018-06-07 ENCOUNTER — Ambulatory Visit (INDEPENDENT_AMBULATORY_CARE_PROVIDER_SITE_OTHER): Payer: Medicare Other | Admitting: Nurse Practitioner

## 2018-06-07 ENCOUNTER — Encounter: Payer: Self-pay | Admitting: Nurse Practitioner

## 2018-06-07 VITALS — BP 124/62 | HR 107 | Ht 70.0 in | Wt 131.0 lb

## 2018-06-07 DIAGNOSIS — R000 Tachycardia, unspecified: Secondary | ICD-10-CM | POA: Diagnosis not present

## 2018-06-07 DIAGNOSIS — G40909 Epilepsy, unspecified, not intractable, without status epilepticus: Secondary | ICD-10-CM | POA: Diagnosis not present

## 2018-06-07 DIAGNOSIS — I313 Pericardial effusion (noninflammatory): Secondary | ICD-10-CM

## 2018-06-07 DIAGNOSIS — I442 Atrioventricular block, complete: Secondary | ICD-10-CM

## 2018-06-07 DIAGNOSIS — J84112 Idiopathic pulmonary fibrosis: Secondary | ICD-10-CM | POA: Diagnosis not present

## 2018-06-07 DIAGNOSIS — I10 Essential (primary) hypertension: Secondary | ICD-10-CM | POA: Diagnosis not present

## 2018-06-07 DIAGNOSIS — I3139 Other pericardial effusion (noninflammatory): Secondary | ICD-10-CM

## 2018-06-07 DIAGNOSIS — I441 Atrioventricular block, second degree: Secondary | ICD-10-CM | POA: Diagnosis not present

## 2018-06-07 DIAGNOSIS — I480 Paroxysmal atrial fibrillation: Secondary | ICD-10-CM

## 2018-06-07 DIAGNOSIS — I447 Left bundle-branch block, unspecified: Secondary | ICD-10-CM | POA: Diagnosis not present

## 2018-06-07 DIAGNOSIS — Z48812 Encounter for surgical aftercare following surgery on the circulatory system: Secondary | ICD-10-CM | POA: Diagnosis not present

## 2018-06-07 LAB — CUP PACEART INCLINIC DEVICE CHECK
Date Time Interrogation Session: 20191113092043
Implantable Lead Implant Date: 20191029
Implantable Lead Location: 753859
Implantable Lead Model: 3830
MDC IDC LEAD IMPLANT DT: 20191029
MDC IDC LEAD LOCATION: 753860
MDC IDC PG IMPLANT DT: 20191029

## 2018-06-07 NOTE — Progress Notes (Signed)
Electrophysiology Office Note Date: 06/07/2018  ID:  Dale Winters, DOB [birth year], MRN [id number]  PCP: Lise Auer, MD Primary Cardiologist: Bing Matter Electrophysiologist: Ladona Ridgel  CC: Pacemaker follow-up  Dale Winters is a 77 y.o. male seen today for Dr Ladona Ridgel.  He presents today for post hospital electrophysiology followup.  He was admitted 05/22/18 with complete heart block and underwent pacemaker implantation. Post pacemaker implant, he developed seizures with micro-perforation and subsequent pericardial effusion requiring pericardiocentesis.  He was seen by neurology and loaded with Keppra. Repeat echos demonstrated no significant recurrence of pericardial effusion.  Since discharge, the patient reports doing relatively well.  He has hospice support at home and PT is coming out.  He feels that he is getting stronger every day.  He has not had recurrent syncope. He is seen with his wife and son today.  He denies chest pain, palpitations, dyspnea (above baseline), PND, orthopnea, nausea, vomiting, dizziness, syncope, edema, weight gain, or early satiety.  Device History: MDT dual chamber PPM implanted 2019 for complete heart block   Past Medical History:  Diagnosis Date  . Abnormal ECG   . Appendicitis    1956  . CHF (congestive heart failure) (HCC)   . Complete heart block (HCC) 04/2018   a. noted 04/2018 with RBBB morphology with prior h/o LBBB - s/p [employer]onic PPM 05/23/18, complicated by atrial lead microperforation felt due to seizure, with pericardial effusion requiring pericardiocentesis  . Dilated cardiomyopathy (HCC)    a. prev EF 40-50%. b. EF 55-60% by echo 04/2018.  Marland Kitchen HTN (hypertension)   . IPF (idiopathic pulmonary fibrosis) (HCC)   . LBBB (left bundle branch block)   . PAF (paroxysmal atrial fibrillation) (HCC)    a. noted during 10-05/2018 adm, not placed on anticoag at that time due to brief nature and recent pericardial effusion.  . Pericardial effusion     . S/P pericardiocentesis   . Seizures (HCC)    a. dx in hospital 04/2018.   Past Surgical History:  Procedure Laterality Date  . APPENDECTOMY    . PACEMAKER IMPLANT N/A 05/23/2018   Procedure: PACEMAKER IMPLANT;  Surgeon: Marinus Maw, MD;  [facility] INVAS[facility] CV LAB;  Service: Cardiovascular;  Laterality: N/A;  . PERICARDIOCENTESIS N/A 05/24/2018   Procedure: PERICARDIOCENTESIS;  Surgeon: Iran Ouch, MD;  Location: [facility] INVASIVE CV LAB;  Service: Cardiovascular;  Laterality: N/A;    Current Outpatient Medications  Medication Sig Dispense Refill  . albuterol (PROVENTIL HFA;VENTOLIN HFA) 108 (90 Base) MCG/ACT inhaler Inhale 1 puff into the lungs every 6 (six) hours as needed for wheezing or shortness of breath.    Marland Kitchen aspirin EC 81 MG tablet Take 81 mg by mouth at bedtime.    . fluticasone furoate-vilanterol (BREO ELLIPTA) 200-25 MCG/INH AEPB Inhale 1 puff into the lungs daily.    Marland Kitchen levETIRAcetam (KEPPRA) 500 MG tablet Take 1 tablet (500 mg total) by mouth 2 (two) times daily. 30 tablet 1  . loratadine (CLARITIN) 10 MG tablet Take 10 mg by mouth daily.    . mirtazapine (REMERON) 15 MG tablet Take 15 mg by mouth at bedtime.     . Multiple Vitamin (MULTIVITAMIN WITH MINERALS) TABS tablet Take 1 tablet by mouth daily.    Marland Kitchen omeprazole (PRILOSEC OTC) 20 MG tablet Take 20 mg by mouth daily as needed (stomach acid).    . OXYGEN Inhale 2-4 L into the lungs continuous.    . Probiotic Product (PROBIOTIC PO) Take 1 tablet by  mouth daily.    . psyllium (METAMUCIL) 58.6 % packet Take 1 packet by mouth daily as needed (for constipation).    . QUEtiapine (SEROQUEL) 100 MG tablet Take 1 tablet (100 mg total) by mouth at bedtime as needed (insominia). 30 tablet 0  . vitamin B-12 (CYANOCOBALAMIN) 500 MCG tablet Take 500 mcg by mouth daily.      No current facility-administered medications for this visit.     Allergies:   Ace inhibitors   Social History: Social History   Socioeconomic  History  . Marital status: Married    Spouse name: Not on file  . Number of children: Not on file  . Years of education: Not on file  . Highest education level: Not on file  Occupational History  . Not on file  Social Needs  . Financial resource strain: Not on file  . Food insecurity:    Worry: Not on file    Inability: Not on file  . Transportation needs:    Medical: Not on file    Non-medical: Not on file  Tobacco Use  . Smoking status: Former Smoker    Packs/day: 0.70    Years: 5.00    Pack years: 3.50    Types: Cigarettes  . Smokeless tobacco: Current User    Types: Chew  Substance and Sexual Activity  . Alcohol use: No    Alcohol/week: 0.0 standard drinks  . Drug use: No  . Sexual activity: Not on file  Lifestyle  . Physical activity:    Days per week: Not on file    Minutes per session: Not on file  . Stress: Not on file  Relationships  . Social connections:    Talks on phone: Not on file    Gets together: Not on file    Attends religious service: Not on file    Active member of club or organization: Not on file    Attends meetings of clubs or organizations: Not on file    Relationship status: Not on file  . Intimate partner violence:    Fear of current or ex partner: Not on file    Emotionally abused: Not on file    Physically abused: Not on file    Forced sexual activity: Not on file  Other Topics Concern  . Not on file  Social History Narrative  . Not on file    Family History: Family History  Problem Relation Age of Onset  . Stroke Mother   . Kidney failure Sister      Review of Systems: All other systems reviewed and are otherwise negative except as noted above.   Physical Exam: VS:  BP 124/62   Pulse (!) 107   Ht 5\' 10"  (1.778 m)   Wt 131 lb (59.4 kg)   SpO2 92%   BMI 18.80 kg/m  , BMI Body mass index is 18.8 kg/m.  GEN- The patient is elderly and chronically ill appearing, alert and oriented x 3 today.   HEENT: normocephalic,  atraumatic; sclera clear, conjunctiva pink; hearing intact; oropharynx clear; neck supple  Lungs- Clear to ausculation bilaterally, normal work of breathing.  No wheezes, rales, rhonchi Heart- Regular rate and rhythm (paced) GI- soft, non-tender, non-distended, bowel sounds present  Extremities- no clubbing, cyanosis, or edema  MS- no significant deformity or atrophy Skin- warm and dry, no rash or lesion; PPM pocket well healed Psych- euthymic mood, full affect Neuro- strength and sensation are intact  PPM Interrogation- reviewed in detail today,  See PACEART report  EKG:  EKG is not ordered today.  Recent Labs: 05/22/2018: ALT 19; TSH 6.304 05/25/2018: Hemoglobin 12.6; Platelets 161 05/26/2018: BUN 16; Creatinine, Ser 1.03; Magnesium 2.1; Potassium 4.0; Sodium 136   Wt Readings from Last 3 Encounters:  06/07/18 131 lb (59.4 kg)  05/28/18 130 lb (59 kg)  05/22/18 135 lb 9.6 oz (61.5 kg)     Other studies Reviewed: Additional studies/ records that were reviewed today include: hospital records   Assessment and Plan:  1.  Intermittent complete heart block Normal PPM function by device check 06/05/18 See Pace Art report No changes today  2.  HTN Stable No change required today  3.  Pericardial effusion Resolved per last hospital echo  4.  Paroxysmal atrial fibrillation New diagnosis post pacemaker implantation Will follow for now with recent effusions If ongoing AF episodes at follow up, consider OAC     Current medicines are reviewed at length with the patient today.   The patient does not have concerns regarding his medicines.  The following changes were made today:  none  Labs/ tests ordered today include:   Orders Placed This Encounter  Procedures  . CUP PACEART INCLINIC DEVICE CHECK     Disposition:   Follow up with Dr Bing Matter 6 weeks, Dr Elberta Fortis for 3 month check (pt lives in [place])     Signed, Gypsy Balsam, [place] 06/07/2018 10:19 AM  [facility]  [facility] [address] [place] [address] [phone] (office) [phone] (fax)

## 2018-06-07 NOTE — Patient Instructions (Signed)
Medication Instructions:   NONE ORDERED  TODAY  If you need a refill on your cardiac medications before your next appointment, please call your pharmacy.   Lab work:   have labs (blood work) drawn today and your tests are completely normal, you will receive your results only by: Marland Kitchen MyChart Message (if you have MyChart) OR . A paper copy in the mail If you have any lab test that is abnormal or we need to change your treatment, we will call you to review the results.  Testing/Procedures: NONE ORDERED  TODAY   Follow-Up: At [facility], you and your health needs are our priority.  As part of our continuing mission to provide you with exceptional heart care, we have created designated Provider Care Teams.  These Care Teams include your primary Cardiologist (physician) and Advanced Practice Providers (APPs -  Physician Assistants and Nurse Practitioners) who all work together to provide you with the care you need, when you need it.  You will need a follow up appointment in [place]     3 months [place]  ( cancel Dr.Taylor)   4-6 weeks Dr. Renelda Loma    Any Other Special Instructions Will Be Listed Below (If Applicable).

## 2018-06-08 DIAGNOSIS — I441 Atrioventricular block, second degree: Secondary | ICD-10-CM | POA: Diagnosis not present

## 2018-06-08 DIAGNOSIS — Z48812 Encounter for surgical aftercare following surgery on the circulatory system: Secondary | ICD-10-CM | POA: Diagnosis not present

## 2018-06-08 DIAGNOSIS — I447 Left bundle-branch block, unspecified: Secondary | ICD-10-CM | POA: Diagnosis not present

## 2018-06-08 DIAGNOSIS — G40909 Epilepsy, unspecified, not intractable, without status epilepticus: Secondary | ICD-10-CM | POA: Diagnosis not present

## 2018-06-08 DIAGNOSIS — J84112 Idiopathic pulmonary fibrosis: Secondary | ICD-10-CM | POA: Diagnosis not present

## 2018-06-08 DIAGNOSIS — I10 Essential (primary) hypertension: Secondary | ICD-10-CM | POA: Diagnosis not present

## 2018-06-09 DIAGNOSIS — J84112 Idiopathic pulmonary fibrosis: Secondary | ICD-10-CM | POA: Diagnosis not present

## 2018-06-09 DIAGNOSIS — I447 Left bundle-branch block, unspecified: Secondary | ICD-10-CM | POA: Diagnosis not present

## 2018-06-09 DIAGNOSIS — I441 Atrioventricular block, second degree: Secondary | ICD-10-CM | POA: Diagnosis not present

## 2018-06-09 DIAGNOSIS — G40909 Epilepsy, unspecified, not intractable, without status epilepticus: Secondary | ICD-10-CM | POA: Diagnosis not present

## 2018-06-09 DIAGNOSIS — I10 Essential (primary) hypertension: Secondary | ICD-10-CM | POA: Diagnosis not present

## 2018-06-09 DIAGNOSIS — Z48812 Encounter for surgical aftercare following surgery on the circulatory system: Secondary | ICD-10-CM | POA: Diagnosis not present

## 2018-06-12 DIAGNOSIS — Z48812 Encounter for surgical aftercare following surgery on the circulatory system: Secondary | ICD-10-CM | POA: Diagnosis not present

## 2018-06-12 DIAGNOSIS — I441 Atrioventricular block, second degree: Secondary | ICD-10-CM | POA: Diagnosis not present

## 2018-06-12 DIAGNOSIS — J84112 Idiopathic pulmonary fibrosis: Secondary | ICD-10-CM | POA: Diagnosis not present

## 2018-06-12 DIAGNOSIS — G40909 Epilepsy, unspecified, not intractable, without status epilepticus: Secondary | ICD-10-CM | POA: Diagnosis not present

## 2018-06-12 DIAGNOSIS — I10 Essential (primary) hypertension: Secondary | ICD-10-CM | POA: Diagnosis not present

## 2018-06-12 DIAGNOSIS — I447 Left bundle-branch block, unspecified: Secondary | ICD-10-CM | POA: Diagnosis not present

## 2018-06-13 DIAGNOSIS — I447 Left bundle-branch block, unspecified: Secondary | ICD-10-CM | POA: Diagnosis not present

## 2018-06-13 DIAGNOSIS — J84112 Idiopathic pulmonary fibrosis: Secondary | ICD-10-CM | POA: Diagnosis not present

## 2018-06-13 DIAGNOSIS — I10 Essential (primary) hypertension: Secondary | ICD-10-CM | POA: Diagnosis not present

## 2018-06-13 DIAGNOSIS — I441 Atrioventricular block, second degree: Secondary | ICD-10-CM | POA: Diagnosis not present

## 2018-06-13 DIAGNOSIS — G40909 Epilepsy, unspecified, not intractable, without status epilepticus: Secondary | ICD-10-CM | POA: Diagnosis not present

## 2018-06-13 DIAGNOSIS — Z48812 Encounter for surgical aftercare following surgery on the circulatory system: Secondary | ICD-10-CM | POA: Diagnosis not present

## 2018-06-14 DIAGNOSIS — I441 Atrioventricular block, second degree: Secondary | ICD-10-CM | POA: Diagnosis not present

## 2018-06-14 DIAGNOSIS — I10 Essential (primary) hypertension: Secondary | ICD-10-CM | POA: Diagnosis not present

## 2018-06-14 DIAGNOSIS — I447 Left bundle-branch block, unspecified: Secondary | ICD-10-CM | POA: Diagnosis not present

## 2018-06-14 DIAGNOSIS — G40909 Epilepsy, unspecified, not intractable, without status epilepticus: Secondary | ICD-10-CM | POA: Diagnosis not present

## 2018-06-14 DIAGNOSIS — J84112 Idiopathic pulmonary fibrosis: Secondary | ICD-10-CM | POA: Diagnosis not present

## 2018-06-14 DIAGNOSIS — Z48812 Encounter for surgical aftercare following surgery on the circulatory system: Secondary | ICD-10-CM | POA: Diagnosis not present

## 2018-06-15 DIAGNOSIS — J84112 Idiopathic pulmonary fibrosis: Secondary | ICD-10-CM | POA: Diagnosis not present

## 2018-06-15 DIAGNOSIS — G40909 Epilepsy, unspecified, not intractable, without status epilepticus: Secondary | ICD-10-CM | POA: Diagnosis not present

## 2018-06-15 DIAGNOSIS — I447 Left bundle-branch block, unspecified: Secondary | ICD-10-CM | POA: Diagnosis not present

## 2018-06-15 DIAGNOSIS — Z48812 Encounter for surgical aftercare following surgery on the circulatory system: Secondary | ICD-10-CM | POA: Diagnosis not present

## 2018-06-15 DIAGNOSIS — I10 Essential (primary) hypertension: Secondary | ICD-10-CM | POA: Diagnosis not present

## 2018-06-15 DIAGNOSIS — I441 Atrioventricular block, second degree: Secondary | ICD-10-CM | POA: Diagnosis not present

## 2018-06-19 DIAGNOSIS — J84112 Idiopathic pulmonary fibrosis: Secondary | ICD-10-CM | POA: Diagnosis not present

## 2018-06-19 DIAGNOSIS — G40909 Epilepsy, unspecified, not intractable, without status epilepticus: Secondary | ICD-10-CM | POA: Diagnosis not present

## 2018-06-19 DIAGNOSIS — I441 Atrioventricular block, second degree: Secondary | ICD-10-CM | POA: Diagnosis not present

## 2018-06-19 DIAGNOSIS — I447 Left bundle-branch block, unspecified: Secondary | ICD-10-CM | POA: Diagnosis not present

## 2018-06-19 DIAGNOSIS — I10 Essential (primary) hypertension: Secondary | ICD-10-CM | POA: Diagnosis not present

## 2018-06-19 DIAGNOSIS — Z48812 Encounter for surgical aftercare following surgery on the circulatory system: Secondary | ICD-10-CM | POA: Diagnosis not present

## 2018-06-20 ENCOUNTER — Encounter (HOSPITAL_COMMUNITY): Payer: Self-pay | Admitting: Emergency Medicine

## 2018-06-20 ENCOUNTER — Observation Stay (HOSPITAL_COMMUNITY)
Admission: EM | Admit: 2018-06-20 | Discharge: 2018-06-21 | Disposition: A | Discharge status: Home / Self Care | Payer: Medicare Other | Attending: Internal Medicine | Admitting: Internal Medicine

## 2018-06-20 ENCOUNTER — Emergency Department (HOSPITAL_COMMUNITY): Payer: Medicare Other

## 2018-06-20 ENCOUNTER — Other Ambulatory Visit: Payer: Self-pay | Admitting: Physician Assistant

## 2018-06-20 DIAGNOSIS — Z950 Presence of cardiac pacemaker: Secondary | ICD-10-CM | POA: Diagnosis not present

## 2018-06-20 DIAGNOSIS — Z79899 Other long term (current) drug therapy: Secondary | ICD-10-CM | POA: Diagnosis not present

## 2018-06-20 DIAGNOSIS — I10 Essential (primary) hypertension: Secondary | ICD-10-CM | POA: Diagnosis not present

## 2018-06-20 DIAGNOSIS — J60 Coalworker's pneumoconiosis: Secondary | ICD-10-CM | POA: Insufficient documentation

## 2018-06-20 DIAGNOSIS — I5022 Chronic systolic (congestive) heart failure: Secondary | ICD-10-CM | POA: Diagnosis not present

## 2018-06-20 DIAGNOSIS — I441 Atrioventricular block, second degree: Secondary | ICD-10-CM | POA: Diagnosis not present

## 2018-06-20 DIAGNOSIS — Z7982 Long term (current) use of aspirin: Secondary | ICD-10-CM | POA: Insufficient documentation

## 2018-06-20 DIAGNOSIS — G40909 Epilepsy, unspecified, not intractable, without status epilepticus: Secondary | ICD-10-CM | POA: Insufficient documentation

## 2018-06-20 DIAGNOSIS — R0600 Dyspnea, unspecified: Secondary | ICD-10-CM

## 2018-06-20 DIAGNOSIS — J84112 Idiopathic pulmonary fibrosis: Secondary | ICD-10-CM | POA: Diagnosis present

## 2018-06-20 DIAGNOSIS — Z9981 Dependence on supplemental oxygen: Secondary | ICD-10-CM | POA: Insufficient documentation

## 2018-06-20 DIAGNOSIS — I110 Hypertensive heart disease with heart failure: Secondary | ICD-10-CM | POA: Diagnosis not present

## 2018-06-20 DIAGNOSIS — I442 Atrioventricular block, complete: Secondary | ICD-10-CM | POA: Diagnosis not present

## 2018-06-20 DIAGNOSIS — R072 Precordial pain: Principal | ICD-10-CM

## 2018-06-20 DIAGNOSIS — I480 Paroxysmal atrial fibrillation: Secondary | ICD-10-CM | POA: Diagnosis not present

## 2018-06-20 DIAGNOSIS — R0602 Shortness of breath: Secondary | ICD-10-CM | POA: Diagnosis not present

## 2018-06-20 DIAGNOSIS — I447 Left bundle-branch block, unspecified: Secondary | ICD-10-CM | POA: Diagnosis not present

## 2018-06-20 DIAGNOSIS — Z87891 Personal history of nicotine dependence: Secondary | ICD-10-CM | POA: Insufficient documentation

## 2018-06-20 DIAGNOSIS — Z48812 Encounter for surgical aftercare following surgery on the circulatory system: Secondary | ICD-10-CM | POA: Diagnosis not present

## 2018-06-20 DIAGNOSIS — R079 Chest pain, unspecified: Secondary | ICD-10-CM | POA: Diagnosis not present

## 2018-06-20 LAB — CBC
HCT: 46.7 % (ref 39.0–52.0)
HEMOGLOBIN: 14.7 g/dL (ref 13.0–17.0)
MCH: 29.2 pg (ref 26.0–34.0)
MCHC: 31.5 g/dL (ref 30.0–36.0)
MCV: 92.7 fL (ref 80.0–100.0)
PLATELETS: 217 10*3/uL (ref 150–400)
RBC: 5.04 MIL/uL (ref 4.22–5.81)
RDW: 12.4 % (ref 11.5–15.5)
WBC: 9.6 10*3/uL (ref 4.0–10.5)
nRBC: 0 % (ref 0.0–0.2)

## 2018-06-20 LAB — BASIC METABOLIC PANEL
ANION GAP: 9 (ref 5–15)
BUN: 7 mg/dL — ABNORMAL LOW (ref 8–23)
CALCIUM: 10 mg/dL (ref 8.9–10.3)
CO2: 32 mmol/L (ref 22–32)
Chloride: 98 mmol/L (ref 98–111)
Creatinine, Ser: 0.8 mg/dL (ref 0.61–1.24)
GLUCOSE: 130 mg/dL — AB (ref 70–99)
POTASSIUM: 3.9 mmol/L (ref 3.5–5.1)
Sodium: 139 mmol/L (ref 135–145)

## 2018-06-20 LAB — I-STAT TROPONIN, ED: Troponin i, poc: 0 ng/mL (ref 0.00–0.08)

## 2018-06-20 MED ORDER — FENTANYL CITRATE (PF) 100 MCG/2ML IJ SOLN
50.0000 ug | Freq: Once | INTRAMUSCULAR | Status: AC
  Administered 2018-06-20: 50 ug via INTRAVENOUS
  Filled 2018-06-20: qty 2

## 2018-06-20 NOTE — ED Triage Notes (Signed)
Reports left sided chest pressure that started today.  Hx of pacemaker placement about a month ago.  Hx of afib.

## 2018-06-21 ENCOUNTER — Other Ambulatory Visit: Payer: Self-pay

## 2018-06-21 ENCOUNTER — Ambulatory Visit (HOSPITAL_BASED_OUTPATIENT_CLINIC_OR_DEPARTMENT_OTHER): Payer: Medicare Other

## 2018-06-21 DIAGNOSIS — J84112 Idiopathic pulmonary fibrosis: Secondary | ICD-10-CM | POA: Diagnosis not present

## 2018-06-21 DIAGNOSIS — Z950 Presence of cardiac pacemaker: Secondary | ICD-10-CM | POA: Diagnosis not present

## 2018-06-21 DIAGNOSIS — Z9981 Dependence on supplemental oxygen: Secondary | ICD-10-CM | POA: Diagnosis not present

## 2018-06-21 DIAGNOSIS — Z87891 Personal history of nicotine dependence: Secondary | ICD-10-CM | POA: Diagnosis not present

## 2018-06-21 DIAGNOSIS — Z7982 Long term (current) use of aspirin: Secondary | ICD-10-CM | POA: Diagnosis not present

## 2018-06-21 DIAGNOSIS — R072 Precordial pain: Secondary | ICD-10-CM | POA: Diagnosis not present

## 2018-06-21 DIAGNOSIS — I480 Paroxysmal atrial fibrillation: Secondary | ICD-10-CM | POA: Diagnosis not present

## 2018-06-21 DIAGNOSIS — I110 Hypertensive heart disease with heart failure: Secondary | ICD-10-CM | POA: Diagnosis not present

## 2018-06-21 DIAGNOSIS — I442 Atrioventricular block, complete: Secondary | ICD-10-CM | POA: Diagnosis not present

## 2018-06-21 DIAGNOSIS — Z79899 Other long term (current) drug therapy: Secondary | ICD-10-CM | POA: Diagnosis not present

## 2018-06-21 DIAGNOSIS — I5022 Chronic systolic (congestive) heart failure: Secondary | ICD-10-CM | POA: Diagnosis not present

## 2018-06-21 DIAGNOSIS — J60 Coalworker's pneumoconiosis: Secondary | ICD-10-CM | POA: Diagnosis not present

## 2018-06-21 DIAGNOSIS — R0600 Dyspnea, unspecified: Secondary | ICD-10-CM | POA: Diagnosis not present

## 2018-06-21 DIAGNOSIS — R079 Chest pain, unspecified: Secondary | ICD-10-CM

## 2018-06-21 LAB — I-STAT TROPONIN, ED: Troponin i, poc: 0.01 ng/mL (ref 0.00–0.08)

## 2018-06-21 LAB — ECHOCARDIOGRAM COMPLETE
HEIGHTINCHES: 70 in
WEIGHTICAEL: 2024.7 [oz_av]

## 2018-06-21 LAB — BRAIN NATRIURETIC PEPTIDE: B NATRIURETIC PEPTIDE 5: 53.9 pg/mL (ref 0.0–100.0)

## 2018-06-21 MED ORDER — ONDANSETRON HCL 4 MG/2ML IJ SOLN: 4.0000 mg | Freq: Four times a day (QID) | INTRAMUSCULAR | Status: DC | PRN

## 2018-06-21 MED ORDER — ALBUTEROL SULFATE (2.5 MG/3ML) 0.083% IN NEBU: 2.5000 mg | INHALATION_SOLUTION | Freq: Four times a day (QID) | RESPIRATORY_TRACT | Status: DC | PRN

## 2018-06-21 MED ORDER — ORAL CARE MOUTH RINSE: 15.0000 mL | Freq: Two times a day (BID) | OROMUCOSAL | Status: DC

## 2018-06-21 MED ORDER — ASPIRIN EC 81 MG PO TBEC: 81.0000 mg | DELAYED_RELEASE_TABLET | Freq: Every day | ORAL | Status: DC

## 2018-06-21 MED ORDER — FLUTICASONE FUROATE-VILANTEROL 200-25 MCG/INH IN AEPB
1.0000 | INHALATION_SPRAY | Freq: Every day | RESPIRATORY_TRACT | Status: DC
  Administered 2018-06-21: 1 via RESPIRATORY_TRACT
  Filled 2018-06-21: qty 28

## 2018-06-21 MED ORDER — LEVETIRACETAM 500 MG PO TABS
500.0000 mg | ORAL_TABLET | Freq: Two times a day (BID) | ORAL | Status: DC
  Administered 2018-06-21: 500 mg via ORAL
  Filled 2018-06-21: qty 1

## 2018-06-21 MED ORDER — NITROGLYCERIN 0.4 MG SL SUBL: 0.4000 mg | SUBLINGUAL_TABLET | SUBLINGUAL | Status: DC | PRN

## 2018-06-21 MED ORDER — QUETIAPINE FUMARATE 100 MG PO TABS: 100.0000 mg | ORAL_TABLET | Freq: Every evening | ORAL | Status: DC | PRN

## 2018-06-21 MED ORDER — ACETAMINOPHEN 325 MG PO TABS: 650.0000 mg | ORAL_TABLET | ORAL | Status: DC | PRN

## 2018-06-21 MED ORDER — MIRTAZAPINE 7.5 MG PO TABS: 15.0000 mg | ORAL_TABLET | Freq: Every day | ORAL | Status: DC

## 2018-06-21 NOTE — Progress Notes (Signed)
Pt discharged for home , left in wheelchair on home o2, accompanied by myself and wife , placed in car.

## 2018-06-21 NOTE — ED Notes (Signed)
Water given with permission from Dr Rhunette Croft.

## 2018-06-21 NOTE — Care Management (Addendum)
CM confirmed with palliative program that pt will be accepted back into program at discharge.  CM informed [facility] that pt is admitted - CM requested resumption orders.    CM informed by agency that pt is not longer active with [facility] - but actually involved in the [facility]  Palliative in the home program.  Pt also received HH with [facility] - RN and PT, pt has a walker and bedside commode in the home and was independent with ADL's.  Pt stays with his wife.  Pt/family plans to return to home with [facility] and Palliative program.  Pt will discharge home via private vehicle.  Pt is on continuous oxygen 3-4 liters at home supplied by [facility] - family will bring portable tank at discharge  CM reached out to [facility] hosp[facility] - CM confirmed that pt is active with agency.  Agency will inform pt CM and CM will reach out to [facility] CM.

## 2018-06-21 NOTE — Discharge Summary (Signed)
Discharge Summary    Patient ID: Dale Winters,  MRN: [id number], DOB/AGE: 1940-08-24 77 y.o.  Admit date: 06/20/2018 Discharge date: 06/21/2018  Primary Care Provider: Luna Kitchens A Primary Cardiologist: Gypsy Balsam, MD  Discharge Diagnoses    Principal Problem:   Dyspnea Active Problems:   Chronic systolic heart failure (HCC)   IPF (idiopathic pulmonary fibrosis) (HCC)   PAF (paroxysmal atrial fibrillation) (HCC)   Precordial chest pain   Allergies Allergies  Allergen Reactions  . Ace Inhibitors Cough    Diagnostic Studies/Procedures    Echocardiogram 06/21/18: Study Conclusions  - Left ventricle: The cavity size was normal. Systolic function was   normal. The estimated ejection fraction was in the range of 55%   to 60%. Wall motion was normal; there were no regional wall   motion abnormalities. - Ventricular septum: Septal motion showed mild dyssynergy and   &quot;bounce&quot;. - Atrial septum: No defect or patent foramen ovale was identified. - Pericardium, extracardiac: A trivial pericardial effusion was   identified.  Impressions:  - Normal LV EF without significant wall motion abnormalities.   Incoordinate motion seen consistent with LBBB. Trivial   pericardial effusion without evidence of tamponade.     Visual EF is normal and similar to prior. 2D measurements do not   track endocardium and are therefore excluded.  _____________   History of Present Illness     77 y.o. 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  The patient states that he has not been feeling well.  He denies any fevers or chills.  He has had a  dry cough and experiences chest pain on breathing heavily.  According to the patient's family this is a deviation from his baseline from a few days ago.  He denies any orthopnea, paroxysmal nocturnal dyspnea or lower extremity edema.  He also does not complain of any palpitations, presyncope or syncope.  In late October 2019 the patient was admitted to the hospital for complaints of dizziness and an episode of syncope.  His EKG revealed transient complete heart block.  He therefore underwent a permanent pacemaker placement.  After his device placement the patient had seizures the next morning.  He then also developed a pericardial effusion (likely due to a microperforation) requiring pericardiocentesis.  During the same admission he had paroxysms of atrial fibrillation but converted to sinus on his own.  Anticoagulation was not started due to his recent effusion.  On this presentation, in the emergency department his troponin was negative x2.  The BNP was 53.9.  Serum potassium was 3.9.  A bedside echo was attempted but it was a technically difficult study due to poor image quality.  Hospital Course     Consultants: None   1. Dyspnea: patient presented with SOB worsening x1 day a/w epigastric/lower chest discomfort. Trop negative x2. BNP 53. EKG with chronic LBBB and paced rhythm. CXR without acute findings; stable chronic interstitial fibrosis. Echo pending to evaluate for recurrent pericardial effusion. PPM interrogated and no recurrent atrial fibrillation since onset 06/04/18. Repeat echo this admission with trivial pericardial effusion (noted to be small on echo 05/27/18). He does not appear volume overloaded on exam. No tamponade on exam. Does not appear to  be related to infection as he is afebrile and without leukocytosis. Unclear etiology. He is feeling back to baseline on the day of discharge.  - Continue previous management of pulmonary fibrosis  2. 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 Paroxysmal atrial fibrillation: no recurrence since 06/04/18. Not started on anticoagulation given recent pericardial effusion. Per last EP note, will consider AC if recurrent episodes.  - Continue routine outpatient PPM interrogations.    _____________  Discharge Vitals Blood pressure 132/81, pulse 99, temperature 98.1 F (36.7 C), temperature source Oral, resp. rate (!) 28, height 5\' 10"  (1.778 m), weight 57.4 kg, SpO2 99 %.  Filed Weights   06/20/18 2232 06/21/18 0514  Weight: 59.4 kg 57.4 kg    Labs & Radiologic Studies    CBC Recent Labs    06/20/18 2244  WBC 9.6  HGB 14.7  HCT 46.7  MCV 92.7  PLT 217   Basic Metabolic Panel Recent Labs    18/29/93 2244  NA 139  K 3.9  CL 98  CO2 32  GLUCOSE 130*  BUN 7*  CREATININE 0.80  CALCIUM 10.0   Liver Function Tests No results for input(s): AST, ALT, ALKPHOS, BILITOT, PROT, ALBUMIN in the last 72 hours. No results for input(s): LIPASE, AMYLASE in the last 72 hours. Cardiac Enzymes No results for input(s): CKTOTAL, CKMB, CKMBINDEX, TROPONINI in the last 72 hours. BNP Invalid input(s): POCBNP D-Dimer No results for input(s): DDIMER in the last 72 hours. Hemoglobin A1C No results for input(s): HGBA1C in the last 72 hours. Fasting Lipid Panel No results for input(s): CHOL, HDL, LDLCALC, TRIG, CHOLHDL, LDLDIRECT in the last 72 hours. Thyroid Function Tests No results for input(s): TSH, T4TOTAL, T3FREE, THYROIDAB in the last 72 hours.  Invalid input(s): FREET3 _____________  Dg Chest 2 View  Result Date: 06/20/2018 CLINICAL DATA:  Left-sided chest pain and shortness of breath beginning today. Idiopathic pulmonary fibrosis. Chronic systolic congestive heart failure. EXAM: CHEST - 2 VIEW COMPARISON:  05/24/2018 FINDINGS: Heart size is within normal limits. Dual lead transvenous pacemaker remains in appropriate position. Low lung volumes  and chronic interstitial fibrosis are stable in appearance. No evidence of superimposed pulmonary infiltrate or pleural effusion. 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 IMPRESSION: No acute findings. 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 Recommend correlation with occupational exposure history. Electronically Signed   By: Myles Rosenthal M.D.   On: 06/20/2018 23:34   Dg Chest 2 View  Result Date: 05/24/2018 CLINICAL DATA:  Pacer placement. EXAM: CHEST - 2 VIEW COMPARISON:  05/22/2018. FINDINGS: There is a left chest wall pacer device with leads in the right atrial appendage and right ventricle. No pneumothorax identified. Unchanged cardiac enlargement. Extensive calcified mediastinal adenopathy and hilar adenopathy is again noted. Advanced interstitial lung disease is identified compatible with pulmonary fibrosis. IMPRESSION: 1. Status post left chest wall pacer placement. No pneumothorax identified. Electronically Signed   By: Signa Kell M.D.   On: 05/24/2018 10:37   Dg Chest 2 View  Result Date: 05/22/2018 CLINICAL DATA:  Left bundle-branch heart block. EXAM: CHEST - 2 VIEW COMPARISON:  CT 07/14/2017, CXR 05/19/2018 FINDINGS: Cardiomegaly with mediastinal and hilar calcifications similar to prior with moderate degree of diffuse interstitial fibrosis. No overt pulmonary edema, effusion or pneumothorax. Nonaneurysmal appearing thoracic aorta. Biapical  pleuroparenchymal thickening is identified. No acute osseous abnormality. IMPRESSION: Chronic interstitial lung disease with fibrosis. Mediastinal and hilar calcifications compatible with old granulomatous disease or possibly pneumoconiosis. Stable cardiomegaly. Electronically Signed   By: Tollie Eth M.D.   On: 05/22/2018 17:39   Ct Head Wo  Contrast  Result Date: 05/25/2018 CLINICAL DATA:  Seizures. EXAM: CT HEAD WITHOUT CONTRAST TECHNIQUE: Contiguous axial images were obtained from the base of the skull through the vertex without intravenous contrast. COMPARISON:  None. FINDINGS: Brain: Mild diffuse cortical atrophy is noted. Mild chronic ischemic white matter disease is noted. No mass effect or midline shift is noted. Ventricular size is within normal limits. There is no evidence of mass lesion, hemorrhage or acute infarction. Vascular: No hyperdense vessel or unexpected calcification. Skull: Normal. Negative for fracture or focal lesion. Sinuses/Orbits: Small right maxillary mucous retention cyst is noted. Other: Fluid is noted in the left mastoid air cells. IMPRESSION: Mild diffuse cortical atrophy. Mild chronic ischemic white matter disease. No acute intracranial abnormality seen. Electronically Signed   By: Lupita Raider, M.D.   On: 05/25/2018 16:57   Dg Chest Port 1 View  Result Date: 05/26/2018 CLINICAL DATA:  Pericardial effusion EXAM: PORTABLE CHEST 1 VIEW COMPARISON:  Portable exam 0526 hours compared 05/25/2018 FINDINGS: LEFT subclavian pacemaker leads project over RIGHT atrium and RIGHT ventricle. Interval removal of infra diaphragmatic Swan-Ganz catheter. Enlargement of cardiac silhouette with slight pulmonary vascular congestion. Extensive calcified mediastinal and hilar adenopathy. Chronic interstitial lung disease/fibrosis changes greatest at the mid to lower lungs. Calcified pulmonary granulomata. No definite acute infiltrate, pleural effusion or pneumothorax. Bones demineralized. IMPRESSION: Enlargement of cardiac silhouette. Extensive old granulomatous disease changes. Pulmonary fibrosis. No definite acute abnormalities. Electronically Signed   By: Ulyses Southward M.D.   On: 05/26/2018 08:55   Dg Chest Port 1 View  Result Date: 05/25/2018 CLINICAL DATA:  Short of breath today.  Pacemaker complications. EXAM: PORTABLE CHEST  1 VIEW COMPARISON:  05/24/2018 and older exams FINDINGS: Left anterior chest wall pacemaker and its leads superior unchanged from the previous day's exam. Cardiac silhouette is top-normal in size. Extensive calcified mediastinal and hilar lymph nodes are stable. No mediastinal or hilar masses. Lungs show coarsely thickened interstitial markings. There is denser scarring at the apices. No evidence of pneumonia or pulmonary edema. No pleural effusion or pneumothorax. There is a catheter extending from the abdomen, tip projecting over the left heart border, new since the prior exam. IMPRESSION: 1. New catheter, presumably placed femorally, has its tip projecting over the left heart border. 2. No change in the left anterior chest wall pacemaker or position of its leads. 3. No acute cardiopulmonary disease. 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 Electronically Signed   By: Amie Portland M.D.   On: 05/25/2018 09:10   Disposition   Patient was seen and examined by Dr. Antoine Poche who deemed patient as stable for discharge. Follow-up has been arranged. Discharge medications as listed below.   Follow-up Plans & Appointments    Follow-up Information    Georgeanna Lea, MD Follow up on 07/20/2018.   Specialty:  Cardiology Why:  Please arrive 15 minutes early for your 11am post-hospital cardiology follow-up appointment Contact information: [address] [place] [address] [phone]            Discharge Medications   Allergies as of 06/21/2018      Reactions   Ace Inhibitors Cough      Medication List    TAKE these  medications   albuterol 108 (90 Base) MCG/ACT inhaler Commonly known as:  PROVENTIL HFA;VENTOLIN HFA Inhale 1 puff into the lungs every 6 (six) hours as needed for wheezing or shortness of breath.   aspirin EC 81 MG tablet Take 81 mg by mouth at bedtime.   BREO ELLIPTA 200-25 MCG/INH Aepb Generic drug:  fluticasone furoate-vilanterol Inhale 1 puff  into the lungs daily.   levETIRAcetam 500 MG tablet Commonly known as:  KEPPRA TAKE 1 TABLET BY MOUTH TWICE DAILY   loratadine 10 MG tablet Commonly known as:  CLARITIN Take 10 mg by mouth daily.   mirtazapine 15 MG tablet Commonly known as:  REMERON Take 15 mg by mouth at bedtime.   multivitamin with minerals Tabs tablet Take 1 tablet by mouth daily.   omeprazole 20 MG tablet Commonly known as:  PRILOSEC OTC Take 20 mg by mouth daily as needed (stomach acid).   OXYGEN Inhale 2-4 L into the lungs continuous.   PROBIOTIC PO Take 1 tablet by mouth daily.   psyllium 58.6 % packet Commonly known as:  METAMUCIL Take 1 packet by mouth daily as needed (for constipation).   QUEtiapine 100 MG tablet Commonly known as:  SEROQUEL Take 1 tablet (100 mg total) by mouth at bedtime as needed (insominia).   vitamin B-12 500 MCG tablet Commonly known as:  CYANOCOBALAMIN Take 500 mcg by mouth daily.         Outstanding Labs/Studies   None  Duration of Discharge Encounter   Greater than 30 minutes including physician time.  Signed, Beatriz Stallion PA-C 06/21/2018, 5:10 PM

## 2018-06-21 NOTE — H&P (Signed)
Cardiology Admission History and Physical:   Patient ID: Dale Winters; MRN: [id number]; DOB: 03-Oct-1940   Admission date: 06/20/2018  Primary Care Provider: Lise Auer, MD Primary Cardiologist: Gypsy Balsam, MD   Chief Complaint:  Shortness of breath and pleuritic chest pain  History of Present Illness:   Dale Winters is a 77 y.o. 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  The patient states that he has not been feeling well.  He denies any fevers or chills.  He has had a dry cough and experiences chest pain on breathing heavily.  According to the patient's family this is a deviation from his baseline from a few days ago.  He denies any orthopnea, paroxysmal nocturnal dyspnea or lower extremity edema.  He also does not complain of any palpitations, presyncope or syncope.  In late October 2019 the patient was admitted to the hospital for complaints of dizziness and an episode of syncope.  His EKG revealed transient complete heart block.  He therefore underwent a permanent pacemaker placement.  After his device placement the patient had seizures the next morning.  He then also developed a pericardial effusion (likely due to a microperforation) requiring pericardiocentesis.  During the same admission he had paroxysms of atrial fibrillation but converted to sinus on his own.  Anticoagulation was not started due to his recent effusion.  On this presentation, in the emergency department his troponin was negative x2.  The BNP was 53.9.  Serum potassium was 3.9.  A bedside echo was attempted but it was a technically difficult study due to poor image quality.   05/23/18:  TTE Study Conclusions - Left ventricle: The cavity size was normal. There was mild concentric hypertrophy. Systolic function was normal. The estimated ejection fraction was in the range of 55% to 60%. Wall motion was normal; there were no regional wall motion abnormalities. There was an increased relative contribution of atrial contraction to ventricular filling. Doppler parameters are consistent with abnormal left ventricular relaxation (grade 1 diastolic dysfunction). - Ventricular septum: Septal motion showed moderate paradox. These changes are consistent with intraventricular conduction delay. - Mitral valve: There was mild regurgitation. Valve area by pressure half-time: 1.93 cm^2. - Atrial septum: There was increased thickness of the septum, consistent with lipomatous hypertrophy. - Tricuspid valve: There was mild regurgitation. - Pulmonic valve: There was mild regurgitation. - Pulmonary arteries: Systolic pressure could not be accurately estimated.    Past Medical History:  Diagnosis Date  . Abnormal ECG   . Appendicitis    1956  . CHF (congestive heart failure) (HCC)   . Complete heart block (HCC) 04/2018   a. noted 04/2018 with RBBB morphology with prior h/o LBBB - s/p [employer] PPM 05/23/18, complicated by atrial lead microperforation felt due to seizure, with pericardial effusion requiring pericardiocentesis  . Dilated cardiomyopathy (HCC)    a. prev EF 40-50%. b. EF 55-60% by echo 04/2018.  Marland Kitchen HTN (hypertension)   . IPF (idiopathic pulmonary fibrosis) (HCC)   . LBBB (left bundle branch block)   . PAF (paroxysmal atrial fibrillation) (HCC)    a. noted during 10-05/2018 adm, not placed on anticoag at that time due to brief nature and recent pericardial effusion.  . Pericardial effusion   .  S/P pericardiocentesis   . Seizures (HCC)    a. dx in hospital 04/2018.    Past Surgical History:  Procedure Laterality Date  . APPENDECTOMY    .  PACEMAKER IMPLANT N/A 05/23/2018   Procedure: PACEMAKER IMPLANT;  Surgeon: Marinus Maw, MD;  Location: [facility] INVASIVE CV LAB;  Service: Cardiovascular;  Laterality: N/A;  . PERICARDIOCENTESIS N/A 05/24/2018   Procedure: PERICARDIOCENTESIS;  Surgeon: Iran Ouch, MD;  Location: [facility] INVASIVE CV LAB;  Service: Cardiovascular;  Laterality: N/A;     Medications Prior to Admission: Prior to Admission medications   Medication Sig Start Date End Date Taking? Authorizing Provider  albuterol (PROVENTIL HFA;VENTOLIN HFA) 108 (90 Base) MCG/ACT inhaler Inhale 1 puff into the lungs every 6 (six) hours as needed for wheezing or shortness of breath.   Yes [provider]  aspirin EC 81 MG tablet Take 81 mg by mouth at bedtime.   Yes [provider]  fluticasone furoate-vilanterol (BREO ELLIPTA) 200-25 MCG/INH AEPB Inhale 1 puff into the lungs daily.   Yes [provider]  levETIRAcetam (KEPPRA) 500 MG tablet TAKE 1 TABLET BY MOUTH TWICE DAILY Patient taking differently: Take 500 mg by mouth 2 (two) times daily.  06/20/18  Yes Seiler, Amber K, NP  loratadine (CLARITIN) 10 MG tablet Take 10 mg by mouth daily.   Yes [provider]  mirtazapine (REMERON) 15 MG tablet Take 15 mg by mouth at bedtime.    Yes [provider]  Multiple Vitamin (MULTIVITAMIN WITH MINERALS) TABS tablet Take 1 tablet by mouth daily.   Yes [provider]  omeprazole (PRILOSEC OTC) 20 MG tablet Take 20 mg by mouth daily as needed (stomach acid).   Yes [provider]  OXYGEN Inhale 2-4 L into the lungs continuous.   Yes [provider]  Probiotic Product (PROBIOTIC PO) Take 1 tablet by mouth daily.   Yes [provider]  psyllium (METAMUCIL) 58.6 % packet Take 1 packet by mouth daily as needed (for constipation). 05/28/18  Yes Dunn, Dayna N, PA-C  QUEtiapine (SEROQUEL) 100 MG tablet Take 1 tablet (100 mg total) by mouth at bedtime as needed (insominia).  05/28/18  Yes Dunn, Dayna N, PA-C  vitamin B-12 (CYANOCOBALAMIN) 500 MCG tablet Take 500 mcg by mouth daily.    Yes [provider]     Allergies:    Allergies  Allergen Reactions  . Ace Inhibitors Cough    Social History:   Social History   Socioeconomic History  . Marital status: Married    Spouse name: Not on file  . Number of children: Not on file  . Years of education: Not on file  . Highest education level: Not on file  Occupational History  . Not on file  Social Needs  . Financial resource strain: Not on file  . Food insecurity:    Worry: Not on file    Inability: Not on file  . Transportation needs:    Medical: Not on file    Non-medical: Not on file  Tobacco Use  . Smoking status: Former Smoker    Packs/day: 0.70    Years: 5.00    Pack years: 3.50    Types: Cigarettes  . Smokeless tobacco: Current User    Types: Chew  Substance and Sexual Activity  . Alcohol use: No    Alcohol/week: 0.0 standard drinks  . Drug use: No  . Sexual activity: Not on file  Lifestyle  . Physical activity:  Days per week: Not on file    Minutes per session: Not on file  . Stress: Not on file  Relationships  . Social connections:    Talks on phone: Not on file    Gets together: Not on file    Attends religious service: Not on file    Active member of club or organization: Not on file    Attends meetings of clubs or organizations: Not on file    Relationship status: Not on file  . Intimate partner violence:    Fear of current or ex partner: Not on file    Emotionally abused: Not on file    Physically abused: Not on file    Forced sexual activity: Not on file  Other Topics Concern  . Not on file  Social History Narrative  . Not on file     Family History:   The patient's family history includes Kidney failure in his sister; Stroke in his mother.     Review of Systems: [y] = yes, [ ]  = no   . General: Weight gain [ ] ; Weight loss [ ] ; Anorexia [ ] ;  Fatigue [place]ienne ]; Fever [ ] ; Chills [ ] ; Weakness [ y]  . Cardiac: Chest pain/pressure [ ] ; Resting SOB [place]ove.[place]ienne ]; Exertional SOB [ ] ; Orthopnea [ ] ; Pedal Edema [ ] ; Palpitations [ ] ; Syncope [ ] ; Presyncope [ ] ; Paroxysmal nocturnal dyspnea[ ]   . Pulmonary: Cough [place]ienne ]; Wheezing[ ] ; Hemoptysis[ ] ; Sputum [ ] ; Snoring [ ]   . GI: Vomiting[ ] ; Dysphagia[ ] ; Melena[ ] ; Hematochezia [ ] ; Heartburn[ ] ; Abdominal pain [ ] ; Constipation [ ] ; Diarrhea [ ] ; BRBPR [ ]   . GU: Hematuria[ ] ; Dysuria [ ] ; Nocturia[ ]   . Vascular: Pain in legs with walking [ ] ; Pain in feet with lying flat [ ] ; Non-healing sores [ ] ; Stroke [ ] ; TIA [ ] ; Slurred speech [ ] ;  . Neuro: Headaches[ ] ; Vertigo[ ] ; Seizures[ ] ; Paresthesias[ ] ;Blurred vision [ ] ; Diplopia [ ] ; Vision changes [ ]   . Ortho/Skin: Arthritis [ ] ; Joint pain [ ] ; Muscle pain [ ] ; Joint swelling [ ] ; Back Pain [ ] ; Rash [ ]   . Psych: Depression[ ] ; Anxiety[ ]   . Heme: Bleeding problems [ ] ; Clotting disorders [ ] ; Anemia [ ]   . Endocrine: Diabetes [ ] ; Thyroid dysfunction[ ]      Physical Exam/Data:   Vitals:   06/21/18 0130 06/21/18 0145 06/21/18 0200 06/21/18 0215  BP: 133/79 126/76 132/78 139/75  Pulse: 96 96 94 95  Resp: (!) 34 (!) 30 (!) 27 (!) 27  Temp:      TempSrc:      SpO2: 98% 97% 98% 98%  Weight:      Height:       No intake or output data in the 24 hours ending 06/21/18 0332 Filed Weights   06/20/18 2232  Weight: 59.4 kg   Body mass index is 19.91 kg/m.  General: Mildly tachypneic HEENT: normal Lymph: no adenopathy Neck: no JVD Endocrine:  No thryomegaly Vascular: No carotid bruits; FA pulses 2+ bilaterally without bruits  Cardiac: Tachycardic, no murmurs Lungs:  clear to auscultation bilaterally, no wheezing, rhonchi or rales  Abd: soft, nontender, no hepatomegaly  Ext: no edema Musculoskeletal:  No deformities, BUE and BLE strength normal and equal Skin: warm and dry  Neuro:  CNs 2-12 intact, no focal abnormalities  noted Psych:  Normal affect    EKG:  The ECG that was done  06/20/18 was personally reviewed and demonstrates a sensing and V pacing.    Laboratory Data:  Chemistry Recent Labs  Lab 06/20/18 2244  NA 139  K 3.9  CL 98  CO2 32  GLUCOSE 130*  BUN 7*  CREATININE 0.80  CALCIUM 10.0  GFRNONAA >60  GFRAA >60  ANIONGAP 9    No results for input(s): PROT, ALBUMIN, AST, ALT, ALKPHOS, BILITOT in the last 168 hours. Hematology Recent Labs  Lab 06/20/18 2244  WBC 9.6  RBC 5.04  HGB 14.7  HCT 46.7  MCV 92.7  MCH 29.2  MCHC 31.5  RDW 12.4  PLT 217   Cardiac EnzymesNo results for input(s): TROPONINI in the last 168 hours.  Recent Labs  Lab 06/20/18 2251 06/21/18 0250  TROPIPOC 0.00 0.01    BNPNo results for input(s): BNP, PROBNP in the last 168 hours.  DDimer No results for input(s): DDIMER in the last 168 hours.  Radiology/Studies:  Dg Chest 2 View  Result Date: 06/20/2018 CLINICAL DATA:  Left-sided chest pain and shortness of breath beginning today. Idiopathic pulmonary fibrosis. Chronic systolic congestive heart failure. EXAM: CHEST - 2 VIEW COMPARISON:  05/24/2018 FINDINGS: Heart size is within normal limits. Dual lead transvenous pacemaker remains in appropriate position. Low lung volumes and chronic interstitial fibrosis are stable in appearance. No evidence of superimposed pulmonary infiltrate or pleural effusion. 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 IMPRESSION: No acute findings. 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 Recommend correlation with occupational exposure history. Electronically Signed   By: Myles Rosenthal M.D.   On: 06/20/2018 23:34    Assessment and Plan:   1. Dyspnea   The patient has had worsening shortness of breath over the past 1 day.   He also complains of pleuritic type chest pain.  His family has been noticing that he has been coughing more than usual.  -Cycle cardiac enzymes and obtain serial ECGs -Admit to telemetry -Obtain a transthoracic echocardiogram to rule out a recurrent pericardial effusion -We will hold aspirin until completion of the echo -Interrogate pacemaker and check impedances of leads -Hold off on anticoagulation given recent history of pericardial effusion post pacemaker implant    Severity of Illness: The appropriate patient status for this patient is OBSERVATION. Observation status is judged to be reasonable and necessary in order to provide the required intensity of service to ensure the patient's safety. The patient's presenting symptoms, physical exam findings, and initial radiographic and laboratory data in the context of their medical condition is felt to place them at decreased risk for further clinical deterioration. Furthermore, it is anticipated that the patient will be medically stable for discharge from the hospital within 2 midnights of admission. The following factors support the patient status of observation.   " The patient's presenting symptoms include shortness of breath. " The physical exam findings include tachypnea. " The initial radiographic and laboratory data are -paced rhythm on ECG.     For questions or updates, please contact [facility] Please consult [email or web address] for contact info under Cardiology/STEMI.    Signed, Lonie Peak, MD  06/21/2018 3:32 AM

## 2018-06-21 NOTE — Progress Notes (Addendum)
Progress Note  Patient Name: Dale Winters Date of Encounter: 06/21/2018  Primary Cardiologist: Gypsy Balsam, MD   Subjective   Patient is feeling back to baseline today. Family thinks maybe chest discomfort and SOB were from gas yesterday. No complaints at this time and he is hopeful to be discharged.   Inpatient Medications    Scheduled Meds: . aspirin EC  81 mg Oral QHS  . fluticasone furoate-vilanterol  1 puff Inhalation Daily  . levETIRAcetam  500 mg Oral BID  . mouth rinse  15 mL Mouth Rinse BID  . mirtazapine  15 mg Oral QHS   Continuous Infusions:  PRN Meds: acetaminophen, albuterol, nitroGLYCERIN, ondansetron (ZOFRAN) IV, QUEtiapine   Vital Signs    Vitals:   06/21/18 0445 06/21/18 0514 06/21/18 0732 06/21/18 0800  BP: 128/82 122/82  (!) 140/94  Pulse: 96 (!) 115 99 89  Resp: (!) 29 (!) 39 (!) 28 (!) 24  Temp:  97.8 F (36.6 C)  98.5 F (36.9 C)  TempSrc:  Oral  Oral  SpO2: 99% 95% 100% 100%  Weight:  57.4 kg    Height:  5\' 10"  (1.778 m)     No intake or output data in the 24 hours ending 06/21/18 1153 Filed Weights   06/20/18 2232 06/21/18 0514  Weight: 59.4 kg 57.4 kg    Telemetry    Paced with intermittent tachycardia; rate generally in the 90s - Personally Reviewed  Physical Exam   GEN: thin elderly gentleman laying in bed in no acute distress.   Neck: No JVD, no carotid bruits Cardiac: RRR, no murmurs, rubs, or gallops.  Respiratory: Clear to auscultation bilaterally, no wheezes/ rales/ rhonchi GI: NABS, Soft, nontender, non-distended  MS: No edema; No deformity. Neuro:  Nonfocal, moving all extremities spontaneously Psych: Normal affect   Labs    Chemistry Recent Labs  Lab 06/20/18 2244  NA 139  K 3.9  CL 98  CO2 32  GLUCOSE 130*  BUN 7*  CREATININE 0.80  CALCIUM 10.0  GFRNONAA >60  GFRAA >60  ANIONGAP 9     Hematology Recent Labs  Lab 06/20/18 2244  WBC 9.6  RBC 5.04  HGB 14.7  HCT 46.7  MCV 92.7  MCH  29.2  MCHC 31.5  RDW 12.4  PLT 217    Cardiac EnzymesNo results for input(s): TROPONINI in the last 168 hours.  Recent Labs  Lab 06/20/18 2251 06/21/18 0250  TROPIPOC 0.00 0.01     BNP Recent Labs  Lab 06/21/18 0244  BNP 53.9     DDimer No results for input(s): DDIMER in the last 168 hours.   Radiology    Dg Chest 2 View  Result Date: 06/20/2018 CLINICAL DATA:  Left-sided chest pain and shortness of breath beginning today. Idiopathic pulmonary fibrosis. Chronic systolic congestive heart failure. EXAM: CHEST - 2 VIEW COMPARISON:  05/24/2018 FINDINGS: Heart size is within normal limits. Dual lead transvenous pacemaker remains in appropriate position. Low lung volumes and chronic interstitial fibrosis are stable in appearance. No evidence of superimposed pulmonary infiltrate or pleural effusion. 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 IMPRESSION: No acute findings. 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 Recommend correlation with occupational exposure history. Electronically Signed   By: Myles Rosenthal M.D.   On: 06/20/2018 23:34    Cardiac Studies   Echocardiogram 05/23/18:  Study  Conclusions - Left ventricle: The cavity size was normal. There was mild concentric hypertrophy. Systolic function was normal. The estimated ejection fraction was in the range of 55% to 60%. Wall motion was normal; there were no regional wall motion abnormalities. There was an increased relative contribution of atrial contraction to ventricular filling. Doppler parameters are consistent with abnormal left ventricular relaxation (grade 1 diastolic dysfunction). - Ventricular septum: Septal motion showed moderate paradox. These changes are consistent with intraventricular  conduction delay. - Mitral valve: There was mild regurgitation. Valve area by pressure half-time: 1.93 cm^2. - Atrial septum: There was increased thickness of the septum, consistent with lipomatous hypertrophy. - Tricuspid valve: There was mild regurgitation. - Pulmonic valve: There was mild regurgitation. - Pulmonary arteries: Systolic pressure could not be accurately estimated.  Patient Profile     77 y.o. male with PMH of CHB s/p PPM (05/23/18) post op course c/b pericardial effusion requiring pericardiocentesis (likely 2/2 microperforation) and paroxysmal atrial fibrillation not started on anticoagulation given effusion, prior systolic CHF (EF 54-00% on last echo 05/27/2018), Chronic LBBB, HTN, idiopathic pulmonary fibrosis on home O2, and seizure disorder, who presents with SOB    Assessment & Plan    1. Dyspnea: patient presented with SOB worsening x1 day a/w epigastric/lower chest discomfort. Trop negative x2. BNP 53. EKG with chronic LBBB and paced rhythm. CXR without acute findings; stable chronic interstitial fibrosis. Echo pending to evaluate for recurrent pericardial effusion. PPM interrogated and no recurrent atrial fibrillation since onset 06/04/18. He does not appear volume overloaded on exam. No tamponade on exam. Does not appear to be related to infection as he is afebrile and without leukocytosis. Unclear etiology. He is feeling back to baseline at this time.  - Echo pending to r/o recurrent pericardial effusion - if no recurrence, no further cardiac work-up needed  2. CHB s/p PPM 05/23/18: PPM interrogated and functioning normally - Continue routine follow-up with EP outpatient  3. Paroxysmal atrial fibrillation: no recurrence since 06/04/18. Not started on anticoagulation given recent pericardial effusion. Per last EP note, will consider AC if recurrent episodes.  - Continue routine outpatient PPM interrogations.   Anticipate discharge home this afternoon after  echocardiogram.     For questions or updates, please contact [facility] Please consult [email or web address] for contact info under Cardiology/STEMI.      Signed, Beatriz Stallion, PA-C  06/21/2018, 11:53 AM   [phone]  History and all data above reviewed.  Patient examined.  I agree with the findings as above.  He is now pain free.  No SOB.  No chest pain.  Wants to go home.  The patient exam reveals COR:RRR  ,  Lungs: Clear  ,  Abd: Positive bowel sounds, no rebound no guarding, Ext No edema  .  All available labs, radiology testing, previous records reviewed. Agree with documented assessment and plan.   PAF.  No recurrence.  No change in therapy.  Chest pain:  Now resolved.  If echo OK and no effusion then OK to discharge. No further work up.    Omran Keelin  1:30 PM  06/21/2018

## 2018-06-21 NOTE — ED Provider Notes (Signed)
[facility] EMERGENCY DEPARTMENT Provider Note   CSN: [id number] Arrival date & time: 06/20/18  2214     History   Chief Complaint Chief Complaint  Patient presents with  . Chest Pain    HPI Dale Winters is a 77 y.o. male.  HPI  77 year old male with history of pulmonary fibrosis, A. fib CHF, recent diagnosis of complete heart block status post Medtronic pacemaker placement on 05-14-18 with a complication of large pericardial effusion that required to be drained comes in with chief complaint of chest pain.  Patient states that he started developing a left-sided chest pain, described as heaviness and pressure earlier today.  Pain is fairly constant, it is nonradiating and worse when he is sitting up or when he is exerting himself.  Patient also has associated shortness of breath.  He has no history of similar chest discomfort with his lung disease.  Family reports that patient had similar type of pain when he had large pericardial effusion.  Review of system is also positive for dizziness.  Patient denies any new cough, fevers, chills, URI-like symptoms, UTI-like symptoms, abdominal pain, nausea or vomiting.  Past Medical History:  Diagnosis Date  . Abnormal ECG   . Appendicitis    1957  . CHF (congestive heart failure) (HCC)   . Complete heart block (HCC) 04/2018   a. noted 04/2018 with RBBB morphology with prior h/o LBBB - s/p Medtronic PPM 05/23/18, complicated by atrial lead microperforation felt due to seizure, with pericardial effusion requiring pericardiocentesis  . Dilated cardiomyopathy (HCC)    a. prev EF 40-50%. b. EF 55-60% by echo 04/2018.  Marland Kitchen HTN (hypertension)   . IPF (idiopathic pulmonary fibrosis) (HCC)   . LBBB (left bundle branch block)   . PAF (paroxysmal atrial fibrillation) (HCC)    a. noted during 10-05/2018 adm, not placed on anticoag at that time due to brief nature and recent pericardial effusion.  . Pericardial effusion   . S/P  pericardiocentesis   . Seizures (HCC)    a. dx in hospital 04/2018.    Patient Active Problem List   Diagnosis Date Noted  . Chest pain 06/21/2018  . PAF (paroxysmal atrial fibrillation) (HCC) 05/28/2018  . Seizures (HCC) 05/25/2018  . ILD (interstitial lung disease) (HCC)   . Cardiac tamponade   . Transient complete heart block (HCC) 05/22/2018  . Left bundle branch block 05/22/2018  . CHB (complete heart block) (HCC) 05/22/2018  . Syncope   . IPF (idiopathic pulmonary fibrosis) (HCC) 06/14/2014  . Dyspnea 06/07/2014  . Cardiomyopathy (HCC) 01/10/2014  . Elevated triglycerides with high cholesterol 06/05/2012  . Chronic systolic heart failure (HCC) 08/17/2011  . Abnormal ECG 08/17/2011  . HTN (hypertension) 08/17/2011    Past Surgical History:  Procedure Laterality Date  . APPENDECTOMY    . PACEMAKER IMPLANT N/A 05/23/2018   Procedure: PACEMAKER IMPLANT;  Surgeon: Marinus Maw, MD;  Location: [facility] INVASIVE CV LAB;  Service: Cardiovascular;  Laterality: N/A;  . PERICARDIOCENTESIS N/A 05/24/2018   Procedure: PERICARDIOCENTESIS;  Surgeon: Iran Ouch, MD;  Location: [facility] INVASIVE CV LAB;  Service: Cardiovascular;  Laterality: N/A;        Home Medications    Prior to Admission medications   Medication Sig Start Date End Date Taking? Authorizing Provider  albuterol (PROVENTIL HFA;VENTOLIN HFA) 108 (90 Base) MCG/ACT inhaler Inhale 1 puff into the lungs every 6 (six) hours as needed for wheezing or shortness of breath.   Yes [provider]  aspirin EC 81 MG tablet Take 81 mg by mouth at bedtime.   Yes [provider]  fluticasone furoate-vilanterol (BREO ELLIPTA) 200-25 MCG/INH AEPB Inhale 1 puff into the lungs daily.   Yes [provider]  levETIRAcetam (KEPPRA) 500 MG tablet TAKE 1 TABLET BY MOUTH TWICE DAILY Patient taking differently: Take 500 mg by mouth 2 (two) times daily.  06/20/18  Yes Seiler, Amber K, NP  loratadine (CLARITIN) 10 MG  tablet Take 10 mg by mouth daily.   Yes [provider]  mirtazapine (REMERON) 15 MG tablet Take 15 mg by mouth at bedtime.    Yes [provider]  Multiple Vitamin (MULTIVITAMIN WITH MINERALS) TABS tablet Take 1 tablet by mouth daily.   Yes [provider]  omeprazole (PRILOSEC OTC) 20 MG tablet Take 20 mg by mouth daily as needed (stomach acid).   Yes [provider]  OXYGEN Inhale 2-4 L into the lungs continuous.   Yes [provider]  Probiotic Product (PROBIOTIC PO) Take 1 tablet by mouth daily.   Yes [provider]  psyllium (METAMUCIL) 58.6 % packet Take 1 packet by mouth daily as needed (for constipation). 05/28/18  Yes Dunn, Dayna N, PA-C  QUEtiapine (SEROQUEL) 100 MG tablet Take 1 tablet (100 mg total) by mouth at bedtime as needed (insominia). 05/28/18  Yes Dunn, Dayna N, PA-C  vitamin B-12 (CYANOCOBALAMIN) 500 MCG tablet Take 500 mcg by mouth daily.    Yes [provider]    Family History Family History  Problem Relation Age of Onset  . Stroke Mother   . Kidney failure Sister     Social History Social History   Tobacco Use  . Smoking status: Former Smoker    Packs/day: 0.70    Years: 5.00    Pack years: 3.50    Types: Cigarettes  . Smokeless tobacco: Current User    Types: Chew  Substance Use Topics  . Alcohol use: No    Alcohol/week: 0.0 standard drinks  . Drug use: No     Allergies   Ace inhibitors   Review of Systems Review of Systems  Constitutional: Positive for activity change.  Respiratory: Positive for shortness of breath.   Cardiovascular: Positive for chest pain.  Neurological: Positive for dizziness.  All other systems reviewed and are negative.    Physical Exam Updated Vital Signs BP 128/82   Pulse 96   Temp 97.7 F (36.5 C) (Oral)   Resp (!) 29   Ht 5\' 8"  (1.727 m)   Wt 59.4 kg   SpO2 99%   BMI 19.91 kg/m   Physical Exam  Constitutional: He is oriented to person,  place, and time. He appears well-developed.  HENT:  Head: Atraumatic.  Neck: Neck supple.  Cardiovascular: Intact distal pulses and normal pulses. Tachycardia present.  Tachycardia  Pulmonary/Chest: Effort normal. He has no wheezes. He has no rhonchi. He has rales.  Musculoskeletal:       Right lower leg: He exhibits no edema.       Left lower leg: He exhibits no edema.  Neurological: He is alert and oriented to person, place, and time.  Skin: Skin is warm.  Nursing note and vitals reviewed.    ED Treatments / Results  Labs (all labs ordered are listed, but only abnormal results are displayed) Labs Reviewed  BASIC METABOLIC PANEL - Abnormal; Notable for the following components:      Result Value   Glucose, Bld 130 (*)  BUN 7 (*)    All other components within normal limits  CBC  BRAIN NATRIURETIC PEPTIDE  I-STAT TROPONIN, ED  I-STAT TROPONIN, ED    EKG EKG Interpretation  Date/Time:  Tuesday June 20 2018 22:22:33 EST Ventricular Rate:  111 PR Interval:  152 QRS Duration: 144 QT Interval:  352 QTC Calculation: 478 R Axis:   -53 Text Interpretation:  Electronic ventricular pacemaker last ekg was not paced Confirmed by Derwood Kaplan [phone]) on 06/20/2018 11:16:44 PM   Radiology Dg Chest 2 View  Result Date: 06/20/2018 CLINICAL DATA:  Left-sided chest pain and shortness of breath beginning today. Idiopathic pulmonary fibrosis. Chronic systolic congestive heart failure. EXAM: CHEST - 2 VIEW COMPARISON:  05/24/2018 FINDINGS: Heart size is within normal limits. Dual lead transvenous pacemaker remains in appropriate position. Low lung volumes and chronic interstitial fibrosis are stable in appearance. No evidence of superimposed pulmonary infiltrate or pleural effusion. 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 IMPRESSION: No acute findings.  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 Recommend correlation with occupational exposure history. Electronically Signed   By: Myles Rosenthal M.D.   On: 06/20/2018 23:34    Procedures Procedures (including critical care time)    EMERGENCY DEPARTMENT [place] CARDIAC EXAM "Study: Limited Ultrasound of the heart and pericardium"  INDICATIONS:Chest pain Multiple views of the heart and pericardium were obtained in real-time with a multi-frequency probe.  PERFORMED UE:[id number]  IMAGES ARCHIVED?: Yes  FINDINGS: Small effusion  LIMITATIONS:  Emergent procedure  VIEWS USED: Subcostal 4 chamber, Parasternal long axis and Parasternal short axis  INTERPRETATION: Cardiac activity present, Pericardial effusion present and Cardiac tamponade absent  CPT Code: 11914-78 (limited transthoracic cardiac)    Medications Ordered in ED Medications  albuterol (PROVENTIL) (2.5 MG/3ML) 0.083% nebulizer solution 2.5 mg (has no administration in time range)  aspirin EC tablet 81 mg (has no administration in time range)  fluticasone furoate-vilanterol (BREO ELLIPTA) 200-25 MCG/INH 1 puff (has no administration in time range)  levETIRAcetam (KEPPRA) tablet 500 mg (has no administration in time range)  mirtazapine (REMERON) tablet 15 mg (has no administration in time range)  QUEtiapine (SEROQUEL) tablet 100 mg (has no administration in time range)  fentaNYL (SUBLIMAZE) injection 50 mcg (50 mcg Intravenous Given 06/20/18 2349)     Initial Impression / Assessment and Plan / ED Course  I have reviewed the triage vital signs and the nursing notes.  Pertinent labs & imaging results that were available during my care of the patient were reviewed by me and considered in my medical decision making (see chart for details).     77 year old male comes in with chief complaint of chest pain. Patient has history of nonischemic cardiomyopathy,  pacemaker in place because of complete heart block, pulmonary fibrosis.  He recently had the pacemaker in place and had complication of pericardial effusion.   Patient is noted to be in a paced rhythm.  No evidence of A. fib on EKG.  He is also noted to be tachycardic with this chest discomfort.  Fentanyl given and that improved his pain but did not resolve it.  Initial troponin is negative.  Differential diagnosis for chest pain includes pericarditis, ACS /demand ischemia because of tachycardia, pain because of pulmonary fibrosis.  Patient is also tachycardic.  PE is in the differential, however pretest probability for that is lower than cardiac cause at this  time.  However, if patient remains tachycardic and cardiology team does not think the underlying causes electrophysiologic, then patient might need dimer.  It is unclear why patient is tachycardic.  There does not appear to be underlying infectious process.   Final Clinical Impressions(s) / ED Diagnoses   Final diagnoses:  Precordial chest pain    ED Discharge Orders    None       Derwood Kaplan, MD 06/21/18 [phone]

## 2018-06-21 NOTE — Progress Notes (Signed)
  Echocardiogram 2D Echocardiogram has been performed.  Boaz Berisha G Daman Steffenhagen 06/21/2018, 3:10 PM

## 2018-06-21 NOTE — ED Notes (Signed)
ED TO INPATIENT HANDOFF REPORT  Name/Age/Gender Marcelles B Mcguinness 77 y.o. male  Code Status Code Status History    Date Active Date Inactive Code Status Order ID Comments User Context   05/24/2018 1235 05/28/2018 1932 DNR [id number]  Josephine Igo, DO Inpatient   05/22/2018 1916 05/24/2018 1235 Full Code [id number]  Leone Brand, NP ED    Questions for Most Recent Historical Code Status (Order [id number])    Question Answer Comment   In the event of cardiac or respiratory ARREST Do not call a "code blue"    In the event of cardiac or respiratory ARREST Do not perform Intubation, CPR, defibrillation or ACLS    In the event of cardiac or respiratory ARREST Use medication by any route, position, wound care, and other measures to relive pain and suffering. May use oxygen, suction and manual treatment of airway obstruction as needed for comfort.         Advance Directive Documentation     Most Recent Value  Type of Advance Directive  Healthcare Power of Attorney, Living will  Pre-existing out of facility DNR order (yellow form or pink MOST form)  -  "MOST" Form in Place?  -      Home/SNF/Other Home  Chief Complaint Chest Pain,Recent Pacemaker   Level of Care/Admitting Diagnosis ED Disposition    ED Disposition Condition Comment   Admit  Hospital Area: [facility] [[id number]]  Level of Care: Telemetry [5]  Diagnosis: Chest pain [937902]  Admitting Physician: Lonie Peak [[id number]]  Attending Physician: Chrystie Nose [phone]  PT Class (Do Not Modify): Observation [104]  PT Acc Code (Do Not Modify): Observation [10022]       Medical History Past Medical History:  Diagnosis Date  . Abnormal ECG   . Appendicitis    1957  . CHF (congestive heart failure) (HCC)   . Complete heart block (HCC) 04/2018   a. noted 04/2018 with RBBB morphology with prior h/o LBBB - s/p Medtronic PPM 05/23/18, complicated by atrial lead microperforation felt due to seizure,  with pericardial effusion requiring pericardiocentesis  . Dilated cardiomyopathy (HCC)    a. prev EF 40-50%. b. EF 55-60% by echo 04/2018.  Marland Kitchen HTN (hypertension)   . IPF (idiopathic pulmonary fibrosis) (HCC)   . LBBB (left bundle branch block)   . PAF (paroxysmal atrial fibrillation) (HCC)    a. noted during 10-05/2018 adm, not placed on anticoag at that time due to brief nature and recent pericardial effusion.  . Pericardial effusion   . S/P pericardiocentesis   . Seizures (HCC)    a. dx in hospital 04/2018.    Allergies Allergies  Allergen Reactions  . Ace Inhibitors Cough    IV Location/Drains/Wounds Patient Lines/Drains/Airways Status   Active Line/Drains/Airways    Name:   Placement date:   Placement time:   Site:   Days:   Peripheral IV 05/24/18 Anterior;Distal;Right Forearm   05/24/18    -    Forearm   28   Peripheral IV 05/26/18 Right Hand   05/26/18    0200    Hand   26   Peripheral IV 06/20/18 Right Forearm   06/20/18    2348    Forearm   1   Incision (Closed) 05/23/18 Chest Left;Upper;Anterior   05/23/18    1520     29   Incision (Closed) 05/25/18 Chest Mid   05/25/18    1000     27  Labs/Imaging Results for orders placed or performed during the hospital encounter of 06/20/18 (from the past 48 hour(s))  Basic metabolic panel     Status: Abnormal   Collection Time: 06/20/18 10:44 PM  Result Value Ref Range   Sodium 139 135 - 145 mmol/L   Potassium 3.9 3.5 - 5.1 mmol/L   Chloride 98 98 - 111 mmol/L   CO2 32 22 - 32 mmol/L   Glucose, Bld 130 (H) 70 - 99 mg/dL   BUN 7 (L) 8 - 23 mg/dL   Creatinine, Ser 1.61 0.61 - 1.24 mg/dL   Calcium 09.6 8.9 - 04.5 mg/dL   GFR calc non Af Amer >60 >60 mL/min   GFR calc Af Amer >60 >60 mL/min   Anion gap 9 5 - 15    Comment: Performed at [facility], [address], [place], [place] [address]  CBC     Status: None   Collection Time: 06/20/18 10:44 PM  Result Value Ref Range   WBC 9.6 4.0 - 10.5 K/uL   RBC  5.04 4.22 - 5.81 MIL/uL   Hemoglobin 14.7 13.0 - 17.0 g/dL   HCT 19.1 47.8 - 29.5 %   MCV 92.7 80.0 - 100.0 fL   MCH 29.2 26.0 - 34.0 pg   MCHC 31.5 30.0 - 36.0 g/dL   RDW 62.1 30.8 - 65.7 %   Platelets 217 150 - 400 K/uL   nRBC 0.0 0.0 - 0.2 %    Comment: Performed at [facility], [address] [place], [place] [address]  I-stat troponin, ED     Status: None   Collection Time: 06/20/18 10:51 PM  Result Value Ref Range   Troponin i, poc 0.00 0.00 - 0.08 ng/mL   Comment 3            Comment: Due to the release kinetics of cTnI, a negative result within the first hours of the onset of symptoms does not rule out myocardial infarction with certainty. If myocardial infarction is still suspected, repeat the test at appropriate intervals.   Brain natriuretic peptide     Status: None   Collection Time: 06/21/18  2:44 AM  Result Value Ref Range   B Natriuretic Peptide 53.9 0.0 - 100.0 pg/mL    Comment: Performed at [facility], [address], [place], [place] [address]  I-stat troponin, ED     Status: None   Collection Time: 06/21/18  2:50 AM  Result Value Ref Range   Troponin i, poc 0.01 0.00 - 0.08 ng/mL   Comment 3            Comment: Due to the release kinetics of cTnI, a negative result within the first hours of the onset of symptoms does not rule out myocardial infarction with certainty. If myocardial infarction is still suspected, repeat the test at appropriate intervals.    Dg Chest 2 View  Result Date: 06/20/2018 CLINICAL DATA:  Left-sided chest pain and shortness of breath beginning today. Idiopathic pulmonary fibrosis. Chronic systolic congestive heart failure. EXAM: CHEST - 2 VIEW COMPARISON:  05/24/2018 FINDINGS: Heart size is within normal limits. Dual lead transvenous pacemaker remains in appropriate position. Low lung volumes and chronic interstitial fibrosis are stable in appearance. No evidence of superimposed pulmonary infiltrate or pleural  effusion. 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 IMPRESSION: No acute findings. 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 Recommend correlation with occupational exposure history. Electronically Signed   By: Myles Rosenthal M.D.   On: 06/20/2018 23:34   EKG Interpretation  Date/Time:  Tuesday June 20 2018 22:22:33 EST Ventricular Rate:  111 PR Interval:  152 QRS Duration: 144 QT Interval:  352 QTC Calculation: 478 R Axis:   -53 Text Interpretation:  Electronic ventricular pacemaker last ekg was not paced Confirmed by Derwood Kaplan [phone]) on 06/20/2018 11:16:44 PM   Pending Labs Unresulted Labs (From admission, onward)   None      Vitals/Pain Today's Vitals   06/21/18 0230 06/21/18 0300 06/21/18 0330 06/21/18 0344  BP: 137/75 134/81 137/84   Pulse: (!) 102 99 100   Resp: (!) 30 (!) 33 (!) 31   Temp:      TempSrc:      SpO2: 96% 97% 97%   Weight:      Height:      PainSc:    0-No pain    Isolation Precautions No active isolations  Medications Medications  albuterol (PROVENTIL HFA;VENTOLIN HFA) 108 (90 Base) MCG/ACT inhaler 1 puff (has no administration in time range)  aspirin EC tablet 81 mg (has no administration in time range)  fluticasone furoate-vilanterol (BREO ELLIPTA) 200-25 MCG/INH 1 puff (has no administration in time range)  levETIRAcetam (KEPPRA) tablet 500 mg (has no administration in time range)  mirtazapine (REMERON) tablet 15 mg (has no administration in time range)  QUEtiapine (SEROQUEL) tablet 100 mg (has no administration in time range)  fentaNYL (SUBLIMAZE) injection 50 mcg (50 mcg Intravenous Given 06/20/18 2349)    Mobility walks

## 2018-06-21 NOTE — Progress Notes (Signed)
[facility]  Patient Status: Active (receiving services up to time of hospitalization)  [facility] is providing the following services: RN, PT and HHA  If patient discharges after hours, please call [phone].   Dale Winters 06/21/2018, 9:53 AM

## 2018-06-21 NOTE — Discharge Instructions (Signed)
PLEASE REMEMBER TO BRING ALL OF YOUR MEDICATIONS TO EACH OF YOUR FOLLOW-UP OFFICE VISITS.  PLEASE ATTEND ALL SCHEDULED FOLLOW-UP APPOINTMENTS.    Continue your current medications - no changes occurred during this hospitalization.   Have a happy Thanksgiving!

## 2018-06-23 DIAGNOSIS — I10 Essential (primary) hypertension: Secondary | ICD-10-CM | POA: Diagnosis not present

## 2018-06-23 DIAGNOSIS — I447 Left bundle-branch block, unspecified: Secondary | ICD-10-CM | POA: Diagnosis not present

## 2018-06-23 DIAGNOSIS — J84112 Idiopathic pulmonary fibrosis: Secondary | ICD-10-CM | POA: Diagnosis not present

## 2018-06-23 DIAGNOSIS — I441 Atrioventricular block, second degree: Secondary | ICD-10-CM | POA: Diagnosis not present

## 2018-06-23 DIAGNOSIS — Z48812 Encounter for surgical aftercare following surgery on the circulatory system: Secondary | ICD-10-CM | POA: Diagnosis not present

## 2018-06-23 DIAGNOSIS — G40909 Epilepsy, unspecified, not intractable, without status epilepticus: Secondary | ICD-10-CM | POA: Diagnosis not present

## 2018-06-24 ENCOUNTER — Other Ambulatory Visit: Payer: Self-pay | Admitting: Physician Assistant

## 2018-06-26 ENCOUNTER — Telehealth: Payer: Self-pay | Admitting: Cardiology

## 2018-06-26 NOTE — Telephone Encounter (Signed)
Patient went to [facility] ED last Wednesday and he was having weakness, pt's pacemaker is working and all cardiac issues checked out, patients daughter walked in wanting patient to be seen today.

## 2018-06-26 NOTE — Telephone Encounter (Signed)
Left message for patient's daughter, Synetta Fail, to return call per [facility].

## 2018-06-27 DIAGNOSIS — I441 Atrioventricular block, second degree: Secondary | ICD-10-CM | POA: Diagnosis not present

## 2018-06-27 DIAGNOSIS — I10 Essential (primary) hypertension: Secondary | ICD-10-CM | POA: Diagnosis not present

## 2018-06-27 DIAGNOSIS — I447 Left bundle-branch block, unspecified: Secondary | ICD-10-CM | POA: Diagnosis not present

## 2018-06-27 DIAGNOSIS — G40909 Epilepsy, unspecified, not intractable, without status epilepticus: Secondary | ICD-10-CM | POA: Diagnosis not present

## 2018-06-27 DIAGNOSIS — J84112 Idiopathic pulmonary fibrosis: Secondary | ICD-10-CM | POA: Diagnosis not present

## 2018-06-27 DIAGNOSIS — Z48812 Encounter for surgical aftercare following surgery on the circulatory system: Secondary | ICD-10-CM | POA: Diagnosis not present

## 2018-06-28 DIAGNOSIS — I10 Essential (primary) hypertension: Secondary | ICD-10-CM | POA: Diagnosis not present

## 2018-06-28 DIAGNOSIS — I441 Atrioventricular block, second degree: Secondary | ICD-10-CM | POA: Diagnosis not present

## 2018-06-28 DIAGNOSIS — J84112 Idiopathic pulmonary fibrosis: Secondary | ICD-10-CM | POA: Diagnosis not present

## 2018-06-28 DIAGNOSIS — I447 Left bundle-branch block, unspecified: Secondary | ICD-10-CM | POA: Diagnosis not present

## 2018-06-28 DIAGNOSIS — Z48812 Encounter for surgical aftercare following surgery on the circulatory system: Secondary | ICD-10-CM | POA: Diagnosis not present

## 2018-06-28 DIAGNOSIS — G40909 Epilepsy, unspecified, not intractable, without status epilepticus: Secondary | ICD-10-CM | POA: Diagnosis not present

## 2018-06-28 NOTE — Telephone Encounter (Signed)
Left message for Synetta Fail to return call.

## 2018-06-30 DIAGNOSIS — I10 Essential (primary) hypertension: Secondary | ICD-10-CM | POA: Diagnosis not present

## 2018-06-30 DIAGNOSIS — I447 Left bundle-branch block, unspecified: Secondary | ICD-10-CM | POA: Diagnosis not present

## 2018-06-30 DIAGNOSIS — I441 Atrioventricular block, second degree: Secondary | ICD-10-CM | POA: Diagnosis not present

## 2018-06-30 DIAGNOSIS — G40909 Epilepsy, unspecified, not intractable, without status epilepticus: Secondary | ICD-10-CM | POA: Diagnosis not present

## 2018-06-30 DIAGNOSIS — Z48812 Encounter for surgical aftercare following surgery on the circulatory system: Secondary | ICD-10-CM | POA: Diagnosis not present

## 2018-06-30 DIAGNOSIS — J84112 Idiopathic pulmonary fibrosis: Secondary | ICD-10-CM | POA: Diagnosis not present

## 2018-07-03 DIAGNOSIS — I441 Atrioventricular block, second degree: Secondary | ICD-10-CM | POA: Diagnosis not present

## 2018-07-03 DIAGNOSIS — I447 Left bundle-branch block, unspecified: Secondary | ICD-10-CM | POA: Diagnosis not present

## 2018-07-03 DIAGNOSIS — J84112 Idiopathic pulmonary fibrosis: Secondary | ICD-10-CM | POA: Diagnosis not present

## 2018-07-03 DIAGNOSIS — Z48812 Encounter for surgical aftercare following surgery on the circulatory system: Secondary | ICD-10-CM | POA: Diagnosis not present

## 2018-07-03 DIAGNOSIS — G40909 Epilepsy, unspecified, not intractable, without status epilepticus: Secondary | ICD-10-CM | POA: Diagnosis not present

## 2018-07-03 DIAGNOSIS — I10 Essential (primary) hypertension: Secondary | ICD-10-CM | POA: Diagnosis not present

## 2018-07-04 ENCOUNTER — Ambulatory Visit (INDEPENDENT_AMBULATORY_CARE_PROVIDER_SITE_OTHER): Payer: Medicare Other | Admitting: Cardiology

## 2018-07-04 ENCOUNTER — Encounter: Payer: Self-pay | Admitting: Cardiology

## 2018-07-04 VITALS — BP 118/62 | HR 96 | Ht 69.0 in | Wt 130.0 lb

## 2018-07-04 DIAGNOSIS — I10 Essential (primary) hypertension: Secondary | ICD-10-CM

## 2018-07-04 DIAGNOSIS — R569 Unspecified convulsions: Secondary | ICD-10-CM | POA: Diagnosis not present

## 2018-07-04 DIAGNOSIS — I447 Left bundle-branch block, unspecified: Secondary | ICD-10-CM

## 2018-07-04 DIAGNOSIS — R0609 Other forms of dyspnea: Secondary | ICD-10-CM

## 2018-07-04 DIAGNOSIS — Z950 Presence of cardiac pacemaker: Secondary | ICD-10-CM | POA: Diagnosis not present

## 2018-07-04 DIAGNOSIS — I442 Atrioventricular block, complete: Secondary | ICD-10-CM

## 2018-07-04 NOTE — Progress Notes (Signed)
Cardiology Office Note:    Date:  07/04/2018   ID:  ANCEL EASLER, DOB February 06, 1941, MRN [id number]  PCP:  Lise Auer, MD  Cardiologist:  Gypsy Balsam, MD    Referring MD: Lise Auer, MD   Chief Complaint  Patient presents with  . Hospitalization Follow-up  Doing much better  History of Present Illness:    Dale Winters is a 77 y.o. male whom I seen initially because of frequent episode of syncope and transient complete heart block he was sent to [facility] for pacemaker implantation procedure was uneventful but shortly after that he suffered seizures and then he ended up having tamponade that required pericardiocentesis luckily in spite of stormy course he recovered quite nicely and doing well.  He described to have some shortness of breath related to progressive pulmonary fibrosis but overall is very happy and feels much better does not have a history of syncope. Recently he ended up going to the emergency room because of some atypical chest pain all work-up has been negative he favor conservative approach.  Past Medical History:  Diagnosis Date  . Abnormal ECG   . Appendicitis    1956  . CHF (congestive heart failure) (HCC)   . Complete heart block (HCC) 04/2018   a. noted 04/2018 with RBBB morphology with prior h/o LBBB - s/p Medtronic PPM 05/23/18, complicated by atrial lead microperforation felt due to seizure, with pericardial effusion requiring pericardiocentesis  . Dilated cardiomyopathy (HCC)    a. prev EF 40-50%. b. EF 55-60% by echo 04/2018.  Marland Kitchen HTN (hypertension)   . IPF (idiopathic pulmonary fibrosis) (HCC)   . LBBB (left bundle branch block)   . PAF (paroxysmal atrial fibrillation) (HCC)    a. noted during 10-05/2018 adm, not placed on anticoag at that time due to brief nature and recent pericardial effusion.  . Pericardial effusion   . S/P pericardiocentesis   . Seizures (HCC)    a. dx in hospital 04/2018.    Past Surgical History:  Procedure  Laterality Date  . APPENDECTOMY    . PACEMAKER IMPLANT N/A 05/23/2018   Procedure: PACEMAKER IMPLANT;  Surgeon: Marinus Maw, MD;  Location: [facility] INVASIVE CV LAB;  Service: Cardiovascular;  Laterality: N/A;  . PERICARDIOCENTESIS N/A 05/24/2018   Procedure: PERICARDIOCENTESIS;  Surgeon: Iran Ouch, MD;  Location: [facility] INVASIVE CV LAB;  Service: Cardiovascular;  Laterality: N/A;    Current Medications: Current Meds  Medication Sig  . albuterol (PROVENTIL HFA;VENTOLIN HFA) 108 (90 Base) MCG/ACT inhaler Inhale 1 puff into the lungs every 6 (six) hours as needed for wheezing or shortness of breath.  Marland Kitchen aspirin EC 81 MG tablet Take 81 mg by mouth at bedtime.  . fluticasone furoate-vilanterol (BREO ELLIPTA) 200-25 MCG/INH AEPB Inhale 1 puff into the lungs daily.  Marland Kitchen levETIRAcetam (KEPPRA) 500 MG tablet TAKE 1 TABLET BY MOUTH TWICE DAILY (Patient taking differently: Take 500 mg by mouth 2 (two) times daily. )  . loratadine (CLARITIN) 10 MG tablet Take 10 mg by mouth daily.  . mirtazapine (REMERON) 15 MG tablet Take 15 mg by mouth at bedtime.   . Multiple Vitamin (MULTIVITAMIN WITH MINERALS) TABS tablet Take 1 tablet by mouth daily.  Marland Kitchen omeprazole (PRILOSEC OTC) 20 MG tablet Take 20 mg by mouth daily as needed (stomach acid).  . OXYGEN Inhale 2-4 L into the lungs continuous.  . Probiotic Product (PROBIOTIC PO) Take 1 tablet by mouth daily.  . psyllium (METAMUCIL) 58.6 % packet Take  1 packet by mouth daily as needed (for constipation).  . QUEtiapine (SEROQUEL) 100 MG tablet Take 1 tablet (100 mg total) by mouth at bedtime as needed (insominia).  . vitamin B-12 (CYANOCOBALAMIN) 500 MCG tablet Take 500 mcg by mouth daily.      Allergies:   Ace inhibitors   Social History   Socioeconomic History  . Marital status: Married    Spouse name: Not on file  . Number of children: Not on file  . Years of education: Not on file  . Highest education level: Not on file  Occupational History  . Not on  file  Social Needs  . Financial resource strain: Not on file  . Food insecurity:    Worry: Not on file    Inability: Not on file  . Transportation needs:    Medical: Not on file    Non-medical: Not on file  Tobacco Use  . Smoking status: Former Smoker    Packs/day: 0.70    Years: 5.00    Pack years: 3.50    Types: Cigarettes  . Smokeless tobacco: Current User    Types: Chew  Substance and Sexual Activity  . Alcohol use: No    Alcohol/week: 0.0 standard drinks  . Drug use: No  . Sexual activity: Not on file  Lifestyle  . Physical activity:    Days per week: Not on file    Minutes per session: Not on file  . Stress: Not on file  Relationships  . Social connections:    Talks on phone: Not on file    Gets together: Not on file    Attends religious service: Not on file    Active member of club or organization: Not on file    Attends meetings of clubs or organizations: Not on file    Relationship status: Not on file  Other Topics Concern  . Not on file  Social History Narrative  . Not on file     Family History: The patient's family history includes Kidney failure in his sister; Stroke in his mother. ROS:   Please see the history of present illness.    All 14 point review of systems negative except as described per history of present illness  EKGs/Labs/Other Studies Reviewed:    06/21/2018 echo - Normal LV EF without significant wall motion abnormalities.   Incoordinate motion seen consistent with LBBB. Trivial   pericardial effusion without evidence of tamponade.  Recent Labs: 05/22/2018: ALT 19; TSH 6.304 05/26/2018: Magnesium 2.1 06/20/2018: BUN 7; Creatinine, Ser 0.80; Hemoglobin 14.7; Platelets 217; Potassium 3.9; Sodium 139 06/21/2018: B Natriuretic Peptide 53.9  Recent Lipid Panel    Component Value Date/Time   CHOL 142 05/23/2018 0032   TRIG 116 05/23/2018 0032   HDL 44 05/23/2018 0032   CHOLHDL 3.2 05/23/2018 0032   VLDL 23 05/23/2018 0032    LDLCALC 75 05/23/2018 0032    Physical Exam:    VS:  BP 118/62   Pulse 96   Ht 5\' 9"  (1.753 m)   Wt 130 lb (59 kg) Comment: Reported  SpO2 97%   BMI 19.20 kg/m     Wt Readings from Last 3 Encounters:  07/04/18 130 lb (59 kg)  06/21/18 126 lb 8.7 oz (57.4 kg)  06/07/18 131 lb (59.4 kg)     GEN:  Well nourished, well developed in no acute distress HEENT: Normal NECK: No JVD; No carotid bruits LYMPHATICS: No lymphadenopathy CARDIAC: RRR, no murmurs, no rubs, no gallops RESPIRATORY:  Clear to auscultation without rales, multiple coarse crackles ABDOMEN: Soft, non-tender, non-distended MUSCULOSKELETAL:  No edema; No deformity  SKIN: Warm and dry LOWER EXTREMITIES: no swelling NEUROLOGIC:  Alert and oriented x 3 PSYCHIATRIC:  Normal affect   ASSESSMENT:    1. Transient complete heart block (HCC)   2. Seizures (HCC)   3. Dyspnea on exertion   4. Essential hypertension   5. Left bundle branch block   6. Pacemaker    PLAN:    In order of problems listed above:  1. Transient complete heart block is being addressed with pacemaker follow-up by our EP team. 2. Seizure disorder managed by neurology. 3. Dyspnea on exertion are related to pulmonary fibrosis. 4. Essential hypertension blood pressure well controlled continue present management. 5. Left bundle branch block.  Chronic.   Medication Adjustments/Labs and Tests Ordered: Current medicines are reviewed at length with the patient today.  Concerns regarding medicines are outlined above.  No orders of the defined types were placed in this encounter.  Medication changes: No orders of the defined types were placed in this encounter.   Signed, Georgeanna Lea, MD, [facility] 07/04/2018 11:50 AM    [facility]

## 2018-07-04 NOTE — Patient Instructions (Signed)
Medication Instructions:  Your physician recommends that you continue on your current medications as directed. Please refer to the Current Medication list given to you today.  If you need a refill on your cardiac medications before your next appointment, please call your pharmacy.   Lab work: None.  If you have labs (blood work) drawn today and your tests are completely normal, you will receive your results only by: . MyChart Message (if you have MyChart) OR . A paper copy in the mail If you have any lab test that is abnormal or we need to change your treatment, we will call you to review the results.  Testing/Procedures: None.   Follow-Up: At [facility], you and your health needs are our priority.  As part of our continuing mission to provide you with exceptional heart care, we have created designated Provider Care Teams.  These Care Teams include your primary Cardiologist (physician) and Advanced Practice Providers (APPs -  Physician Assistants and Nurse Practitioners) who all work together to provide you with the care you need, when you need it. You will need a follow up appointment in 3 months.  Please call our office 2 months in advance to schedule this appointment.  You may see Robert Krasowski, MD or another member of our [facility] Provider Team in [place]: Brian Munley, MD . Rajan Revankar, MD  Any Other Special Instructions Will Be Listed Below (If Applicable).     

## 2018-07-05 DIAGNOSIS — I10 Essential (primary) hypertension: Secondary | ICD-10-CM | POA: Diagnosis not present

## 2018-07-05 DIAGNOSIS — Z48812 Encounter for surgical aftercare following surgery on the circulatory system: Secondary | ICD-10-CM | POA: Diagnosis not present

## 2018-07-05 DIAGNOSIS — I447 Left bundle-branch block, unspecified: Secondary | ICD-10-CM | POA: Diagnosis not present

## 2018-07-05 DIAGNOSIS — G40909 Epilepsy, unspecified, not intractable, without status epilepticus: Secondary | ICD-10-CM | POA: Diagnosis not present

## 2018-07-05 DIAGNOSIS — J84112 Idiopathic pulmonary fibrosis: Secondary | ICD-10-CM | POA: Diagnosis not present

## 2018-07-05 DIAGNOSIS — I441 Atrioventricular block, second degree: Secondary | ICD-10-CM | POA: Diagnosis not present

## 2018-07-06 DIAGNOSIS — Z48812 Encounter for surgical aftercare following surgery on the circulatory system: Secondary | ICD-10-CM | POA: Diagnosis not present

## 2018-07-06 DIAGNOSIS — J84112 Idiopathic pulmonary fibrosis: Secondary | ICD-10-CM | POA: Diagnosis not present

## 2018-07-06 DIAGNOSIS — I447 Left bundle-branch block, unspecified: Secondary | ICD-10-CM | POA: Diagnosis not present

## 2018-07-06 DIAGNOSIS — I10 Essential (primary) hypertension: Secondary | ICD-10-CM | POA: Diagnosis not present

## 2018-07-06 DIAGNOSIS — G40909 Epilepsy, unspecified, not intractable, without status epilepticus: Secondary | ICD-10-CM | POA: Diagnosis not present

## 2018-07-06 DIAGNOSIS — I441 Atrioventricular block, second degree: Secondary | ICD-10-CM | POA: Diagnosis not present

## 2018-07-10 DIAGNOSIS — Z48812 Encounter for surgical aftercare following surgery on the circulatory system: Secondary | ICD-10-CM | POA: Diagnosis not present

## 2018-07-10 DIAGNOSIS — I10 Essential (primary) hypertension: Secondary | ICD-10-CM | POA: Diagnosis not present

## 2018-07-10 DIAGNOSIS — I447 Left bundle-branch block, unspecified: Secondary | ICD-10-CM | POA: Diagnosis not present

## 2018-07-10 DIAGNOSIS — J84112 Idiopathic pulmonary fibrosis: Secondary | ICD-10-CM | POA: Diagnosis not present

## 2018-07-10 DIAGNOSIS — G40909 Epilepsy, unspecified, not intractable, without status epilepticus: Secondary | ICD-10-CM | POA: Diagnosis not present

## 2018-07-10 DIAGNOSIS — I441 Atrioventricular block, second degree: Secondary | ICD-10-CM | POA: Diagnosis not present

## 2018-07-11 DIAGNOSIS — J84112 Idiopathic pulmonary fibrosis: Secondary | ICD-10-CM | POA: Diagnosis not present

## 2018-07-11 DIAGNOSIS — I10 Essential (primary) hypertension: Secondary | ICD-10-CM | POA: Diagnosis not present

## 2018-07-11 DIAGNOSIS — G40909 Epilepsy, unspecified, not intractable, without status epilepticus: Secondary | ICD-10-CM | POA: Diagnosis not present

## 2018-07-11 DIAGNOSIS — I441 Atrioventricular block, second degree: Secondary | ICD-10-CM | POA: Diagnosis not present

## 2018-07-11 DIAGNOSIS — I447 Left bundle-branch block, unspecified: Secondary | ICD-10-CM | POA: Diagnosis not present

## 2018-07-11 DIAGNOSIS — Z48812 Encounter for surgical aftercare following surgery on the circulatory system: Secondary | ICD-10-CM | POA: Diagnosis not present

## 2018-07-17 DIAGNOSIS — I441 Atrioventricular block, second degree: Secondary | ICD-10-CM | POA: Diagnosis not present

## 2018-07-17 DIAGNOSIS — I10 Essential (primary) hypertension: Secondary | ICD-10-CM | POA: Diagnosis not present

## 2018-07-17 DIAGNOSIS — I447 Left bundle-branch block, unspecified: Secondary | ICD-10-CM | POA: Diagnosis not present

## 2018-07-17 DIAGNOSIS — Z48812 Encounter for surgical aftercare following surgery on the circulatory system: Secondary | ICD-10-CM | POA: Diagnosis not present

## 2018-07-17 DIAGNOSIS — J84112 Idiopathic pulmonary fibrosis: Secondary | ICD-10-CM | POA: Diagnosis not present

## 2018-07-17 DIAGNOSIS — G40909 Epilepsy, unspecified, not intractable, without status epilepticus: Secondary | ICD-10-CM | POA: Diagnosis not present

## 2018-07-20 ENCOUNTER — Ambulatory Visit: Payer: Medicare Other | Admitting: Cardiology

## 2018-08-02 ENCOUNTER — Ambulatory Visit (INDEPENDENT_AMBULATORY_CARE_PROVIDER_SITE_OTHER): Payer: Medicare Other | Admitting: Diagnostic Neuroimaging

## 2018-08-02 ENCOUNTER — Encounter: Payer: Self-pay | Admitting: Diagnostic Neuroimaging

## 2018-08-02 VITALS — BP 120/78 | HR 98 | Ht 69.0 in | Wt 132.4 lb

## 2018-08-02 DIAGNOSIS — G40909 Epilepsy, unspecified, not intractable, without status epilepticus: Secondary | ICD-10-CM | POA: Diagnosis not present

## 2018-08-02 MED ORDER — LEVETIRACETAM 500 MG PO TABS: 500.0000 mg | ORAL_TABLET | Freq: Two times a day (BID) | ORAL | 4 refills | Status: DC

## 2018-08-02 NOTE — Progress Notes (Signed)
[facility]  PATIENT: Dale Winters DOB: 08-07-1940  REFERRING CLINICIAN: Kirkpatrick HISTORY FROM: patient  REASON FOR VISIT: new consult    HISTORICAL  CHIEF COMPLAINT:  Chief Complaint  Patient presents with  . New Patient (Initial Visit)    Referred by Dr. Ritta Slot [facility])  . Seizures    Had some seizures prior to Pacemaker placement, but did not know, then after PMP had 2 seizures, since keppra no seizures.      HISTORY OF PRESENT ILLNESS:   78 year old male here for evaluation of seizure disorder.  Patient has been having intermittent episodes of "blanking out" with staring spells and unresponsiveness lasting for a few seconds, for the past 6 to 12 months.  In October 2019 patient was in the hospital for pacemaker placement.  Postoperatively patient had a generalized convulsive seizure with postictal confusion.  IV Keppra was ordered.  Patient then had a second seizure just before IV Keppra was started.  Patient was then transition to oral levetiracetam discharged home.  Since that time no further seizures.  Possibility of mild dementia was raised in the hospital.  Patient having some mild short-term memory problems for the past 1 year.  He has had some change in ADLs, mainly limited to his physical limitations.  No family history of seizure.  Patient is tolerating medication.  No side effects.   REVIEW OF SYSTEMS: Full 14 system review of systems performed and negative with exception of: As per HPI.  ALLERGIES: Allergies  Allergen Reactions  . Ace Inhibitors Cough    HOME MEDICATIONS: Outpatient Medications Prior to Visit  Medication Sig Dispense Refill  . albuterol (PROVENTIL HFA;VENTOLIN HFA) 108 (90 Base) MCG/ACT inhaler Inhale 1 puff into the lungs every 6 (six) hours as needed for wheezing or shortness of breath.    Marland Kitchen aspirin EC 81 MG tablet Take 81 mg by mouth at bedtime.    . fluticasone furoate-vilanterol (BREO ELLIPTA) 200-25  MCG/INH AEPB Inhale 1 puff into the lungs daily.    Marland Kitchen levETIRAcetam (KEPPRA) 500 MG tablet TAKE 1 TABLET BY MOUTH TWICE DAILY (Patient taking differently: Take 500 mg by mouth 2 (two) times daily. ) 60 tablet 11  . loratadine (CLARITIN) 10 MG tablet Take 10 mg by mouth daily.    . mirtazapine (REMERON) 15 MG tablet Take 15 mg by mouth at bedtime.     . Multiple Vitamin (MULTIVITAMIN WITH MINERALS) TABS tablet Take 1 tablet by mouth daily.    Marland Kitchen omeprazole (PRILOSEC OTC) 20 MG tablet Take 20 mg by mouth daily as needed (stomach acid).    . OXYGEN Inhale 2-4 L into the lungs continuous.    . Probiotic Product (PROBIOTIC PO) Take 1 tablet by mouth daily.    . psyllium (METAMUCIL) 58.6 % packet Take 1 packet by mouth daily as needed (for constipation).    . QUEtiapine (SEROQUEL) 100 MG tablet Take 1 tablet (100 mg total) by mouth at bedtime as needed (insominia). 30 tablet 0  . vitamin B-12 (CYANOCOBALAMIN) 500 MCG tablet Take 500 mcg by mouth daily.      No facility-administered medications prior to visit.     PAST MEDICAL HISTORY: Past Medical History:  Diagnosis Date  . Abnormal ECG   . Appendicitis    1956  . CHF (congestive heart failure) (HCC)   . Complete heart block (HCC) 04/2018   a. noted 04/2018 with RBBB morphology with prior h/o LBBB - s/p Medtronic PPM 05/23/18, complicated by atrial  lead microperforation felt due to seizure, with pericardial effusion requiring pericardiocentesis  . Dilated cardiomyopathy (HCC)    a. prev EF 40-50%. b. EF 55-60% by echo 04/2018.  Marland Kitchen HTN (hypertension)   . IPF (idiopathic pulmonary fibrosis) (HCC)   . LBBB (left bundle branch block)   . PAF (paroxysmal atrial fibrillation) (HCC)    a. noted during 10-05/2018 adm, not placed on anticoag at that time due to brief nature and recent pericardial effusion.  . Pericardial effusion   . S/P pericardiocentesis   . Seizures (HCC)    a. dx in hospital 04/2018.    PAST SURGICAL HISTORY: Past Surgical  History:  Procedure Laterality Date  . APPENDECTOMY    . PACEMAKER IMPLANT N/A 05/23/2018   Procedure: PACEMAKER IMPLANT;  Surgeon: Marinus Maw, MD;  [facility]: [facility] INVASIVE CV LAB;  Service: Cardiovascular;  Laterality: N/A;  . PERICARDIOCENTESIS N/A 05/24/2018   Procedure: PERICARDIOCENTESIS;  Surgeon: Iran Ouch, MD;  Location: [facility] INVASIVE CV LAB;  Service: Cardiovascular;  Laterality: N/A;    FAMILY HISTORY: Family History  Problem Relation Age of Onset  . Stroke Mother   . Kidney failure Sister     SOCIAL HISTORY: Social History   Socioeconomic History  . Marital status: Married    Spouse name: Not on file  . Number of children: Not on file  . Years of education: Not on file  . Highest education level: Not on file  Occupational History  . Not on file  Social Needs  . Financial resource strain: Not on file  . Food insecurity:    Worry: Not on file    Inability: Not on file  . Transportation needs:    Medical: Not on file    Non-medical: Not on file  Tobacco Use  . Smoking status: Former Smoker    Packs/day: 0.70    Years: 5.00    Pack years: 3.50    Types: Cigarettes  . Smokeless tobacco: Current User    Types: Chew  Substance and Sexual Activity  . Alcohol use: No    Alcohol/week: 0.0 standard drinks  . Drug use: No  . Sexual activity: Not on file  Lifestyle  . Physical activity:    Days per week: Not on file    Minutes per session: Not on file  . Stress: Not on file  Relationships  . Social connections:    Talks on phone: Not on file    Gets together: Not on file    Attends religious service: Not on file    Active member of club or organization: Not on file    Attends meetings of clubs or organizations: Not on file    Relationship status: Not on file  . Intimate partner violence:    Fear of current or ex partner: Not on file    Emotionally abused: Not on file    Physically abused: Not on file    Forced sexual activity: Not on file    Other Topics Concern  . Not on file  Social History Narrative   Lives home with spouse.  Is retired.  Education HS/ Trade school.  Children 3.  Daughter Synetta Fail here today.   Drinks decaff tea, coffee.       PHYSICAL EXAM  GENERAL EXAM/CONSTITUTIONAL: Vitals:  Vitals:   08/02/18 1139  BP: 120/78  Pulse: 98  Weight: 132 lb 6.4 oz (60.1 kg)  Height: 5\' 9"  (1.753 m)     Body mass index is 19.55  kg/m. Wt Readings from Last 3 Encounters:  08/02/18 132 lb 6.4 oz (60.1 kg)  07/04/18 130 lb (59 kg)  06/21/18 126 lb 8.7 oz (57.4 kg)     Patient is in no distress; well developed, nourished and groomed; neck is supple  ON OXYGEN VIA O2  ELDERLY, FRAIL APPEARING; GOOD SPIRITS  CARDIOVASCULAR:  Examination of carotid arteries is normal; no carotid bruits  Regular rate and rhythm, no murmurs  Examination of peripheral vascular system by observation and palpation is normal  EYES:  Ophthalmoscopic exam of optic discs and posterior segments is normal; no papilledema or hemorrhages  No exam data present  MUSCULOSKELETAL:  Gait, strength, tone, movements noted in Neurologic exam below  NEUROLOGIC: MENTAL STATUS:  No flowsheet data found.  awake, alert, oriented to person, place and time  recent and remote memory intact  normal attention and concentration  language fluent, comprehension intact, naming intact  fund of knowledge appropriate  CRANIAL NERVE:   2nd - no papilledema on fundoscopic exam  2nd, 3rd, 4th, 6th - RIGHT PUPIL 3, LEFT PUPIL 2, MIN RXN BILATERALLY; visual fields full to confrontation, extraocular muscles intact, no nystagmus  5th - facial sensation symmetric  7th - facial strength symmetric  8th - hearing intact  9th - palate elevates symmetrically, uvula midline  11th - shoulder shrug symmetric  12th - tongue protrusion midline  MOTOR:   normal bulk and tone, full strength in the BUE, BLE; EXCEPT RIGHT DF 4  SENSORY:   normal  and symmetric to light touch, temperature, vibration; EXCEPT RIGHT FOOT DECR VIB  COORDINATION:   finger-nose-finger, fine finger movements normal  REFLEXES:   deep tendon reflexes TRACE and symmetric  GAIT/STATION:   narrow based gait     DIAGNOSTIC DATA (LABS, IMAGING, TESTING) - I reviewed patient records, labs, notes, testing and imaging myself where available.  Lab Results  Component Value Date   WBC 9.6 06/20/2018   HGB 14.7 06/20/2018   HCT 46.7 06/20/2018   MCV 92.7 06/20/2018   PLT 217 06/20/2018      Component Value Date/Time   NA 139 06/20/2018 2244   K 3.9 06/20/2018 2244   CL 98 06/20/2018 2244   CO2 32 06/20/2018 2244   GLUCOSE 130 (H) 06/20/2018 2244   BUN 7 (L) 06/20/2018 2244   CREATININE 0.80 06/20/2018 2244   CALCIUM 10.0 06/20/2018 2244   PROT 8.2 (H) 05/22/2018 2019   ALBUMIN 4.2 05/22/2018 2019   AST 32 05/22/2018 2019   ALT 19 05/22/2018 2019   ALKPHOS 104 05/22/2018 2019   BILITOT 0.8 05/22/2018 2019   GFRNONAA >60 06/20/2018 2244   GFRAA >60 06/20/2018 2244   Lab Results  Component Value Date   CHOL 142 05/23/2018   HDL 44 05/23/2018   LDLCALC 75 05/23/2018   TRIG 116 05/23/2018   CHOLHDL 3.2 05/23/2018   Lab Results  Component Value Date   HGBA1C 4.5 (L) 05/22/2018   No results found for: VITAMINB12 Lab Results  Component Value Date   TSH 6.304 (H) 05/22/2018     05/25/18 CT head [I reviewed images myself and agree with interpretation. -VRP]  - Mild diffuse cortical atrophy. Mild chronic ischemic white matter disease. No acute intracranial abnormality seen.  05/24/18 EEG - EEG Abnormalities: 1) mild generalized irregular delta activity - Clinical Interpretation: This EEG is consistent with a mild generalized nonspecific cerebral dysfunction (encephalopathy). There was no seizure or seizure predisposition recorded on this study. Please note that  a normal EEG does not preclude the possibility of epilepsy.      ASSESSMENT AND PLAN  78 y.o. year old male here with new onset of seizure disorder starting in 2019.  Now stable on levetiracetam.  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  Dx:  1. Seizure disorder (HCC)     PLAN:  - continue levetiracetam 500mg  twice a day   - According to [place] law, you can not drive unless you are seizure / syncope free for at least 6 months and under physician's care.   - Please maintain precautions. Do not participate in activities where a loss of awareness could harm you or someone else. No swimming alone, no tub bathing, no hot tubs, no driving, no operating motorized vehicles (cars, ATVs, motocycles, etc), lawnmowers, power tools or firearms. No standing at heights, such as rooftops, ladders or stairs. Avoid hot objects such as stoves, heaters, open fires. Wear a helmet when riding a bicycle, scooter, skateboard, etc. and avoid areas of traffic. Set your water heater to 120 degrees or less.   Meds ordered this encounter  Medications  . levETIRAcetam (KEPPRA) 500 MG tablet    Sig: Take 1 tablet (500 mg total) by mouth 2 (two) times daily.    Dispense:  180 tablet    Refill:  4   Return in about 6 months (around 01/31/2019).  I reviewed images, labs, notes, records myself. I summarized findings and reviewed with patient, for this high risk condition (seizure disorder) requiring high complexity decision making.    Suanne Marker, MD 08/02/2018, 12:14 PM Certified in Neurology, Neurophysiology and Neuroimaging  [facility] [address] [place], [place] [address] [phone]

## 2018-08-18 ENCOUNTER — Encounter: Payer: Self-pay | Admitting: Cardiology

## 2018-08-29 ENCOUNTER — Encounter: Payer: No Typology Code available for payment source | Admitting: Internal Medicine

## 2018-09-08 ENCOUNTER — Encounter: Payer: Self-pay | Admitting: Cardiology

## 2018-09-08 ENCOUNTER — Ambulatory Visit (INDEPENDENT_AMBULATORY_CARE_PROVIDER_SITE_OTHER): Payer: Medicare Other | Admitting: Cardiology

## 2018-09-08 VITALS — BP 126/82 | HR 102 | Ht 70.0 in | Wt 138.0 lb

## 2018-09-08 DIAGNOSIS — I442 Atrioventricular block, complete: Secondary | ICD-10-CM

## 2018-09-08 NOTE — Patient Instructions (Addendum)
Medication Instructions:  Your physician recommends that you continue on your current medications as directed. Please refer to the Current Medication list given to you today.  *If you need a refill on your cardiac medications before your next appointment, please call your pharmacy*  Labwork: None ordered  Testing/Procedures: None ordered  Follow-Up: Remote monitoring is used to monitor your Pacemaker or ICD from home. This monitoring reduces the number of office visits required to check your device to one time per year. It allows [place] to keep an eye on the functioning of your device to ensure it is working properly. You are scheduled for a device check from home on 12/11/2018. You may send your transmission at any time that day. If you have a wireless device, the transmission will be sent automatically. After your physician reviews your transmission, you will receive a postcard with your next transmission date.  Your physician wants you to follow-up in: 9 months with Dr. Elberta Fortis.  You will receive a reminder letter in the mail two months in advance. If you don't receive a letter, please call our office to schedule the follow-up appointment.  Thank you for choosing [facility]!!   Dory Horn, RN [phone]  Any Other Special Instructions Will Be Listed Below (If Applicable). DEVICE CLINIC # [phone]

## 2018-09-08 NOTE — Progress Notes (Signed)
Electrophysiology Office Note   Date:  09/08/2018   ID:  LABRYANT CHALIFOUX, DOB [birth year], MRN [id number]  PCP:  Lise Auer, MD  Cardiologist:  Bing Matter Primary Electrophysiologist:  Somya Jauregui Jorja Loa, MD    No chief complaint on file.    History of Present Illness: Dale Winters is a 78 y.o. male who is being seen today for the evaluation of complete heart block at the request of Lise Auer, MD. Presenting today for electrophysiology evaluation.  He has a history of complete heart block status is to Medtronic dual-chamber pacemaker, paroxysmal atrial fibrillation, idiopathic pulmonary fibrosis on home oxygen.  Pacemaker was implanted 06/23/2018.  He had a seizure shortly thereafter causing cardiac tamponade requiring pericardiocentesis.    Today, he denies symptoms of palpitations, chest pain, shortness of breath, orthopnea, PND, lower extremity edema, claudication, dizziness, presyncope, syncope, bleeding, or neurologic sequela. The patient is tolerating medications without difficulties.    Past Medical History:  Diagnosis Date  . Abnormal ECG   . Appendicitis    1956  . CHF (congestive heart failure) (HCC)   . Complete heart block (HCC) 04/2018   a. noted 04/2018 with RBBB morphology with prior h/o LBBB - s/p Medtronic PPM 05/23/18, complicated by atrial lead microperforation felt due to seizure, with pericardial effusion requiring pericardiocentesis  . Dilated cardiomyopathy (HCC)    a. prev EF 40-50%. b. EF 55-60% by echo 04/2018.  Marland Kitchen HTN (hypertension)   . IPF (idiopathic pulmonary fibrosis) (HCC)   . LBBB (left bundle branch block)   . PAF (paroxysmal atrial fibrillation) (HCC)    a. noted during 10-05/2018 adm, not placed on anticoag at that time due to brief nature and recent pericardial effusion.  . Pericardial effusion   . S/P pericardiocentesis   . Seizures (HCC)    a. dx in hospital 04/2018.   Past Surgical History:  Procedure Laterality Date  .  APPENDECTOMY    . PACEMAKER IMPLANT N/A 05/23/2018   Procedure: PACEMAKER IMPLANT;  Surgeon: Marinus Maw, MD;  Location: [facility] INVASIVE CV LAB;  Service: Cardiovascular;  Laterality: N/A;  . PERICARDIOCENTESIS N/A 05/24/2018   Procedure: PERICARDIOCENTESIS;  Surgeon: Iran Ouch, MD;  Location: [facility] INVASIVE CV LAB;  Service: Cardiovascular;  Laterality: N/A;     Current Outpatient Medications  Medication Sig Dispense Refill  . albuterol (PROVENTIL HFA;VENTOLIN HFA) 108 (90 Base) MCG/ACT inhaler Inhale 1 puff into the lungs every 6 (six) hours as needed for wheezing or shortness of breath.    Marland Kitchen aspirin EC 81 MG tablet Take 81 mg by mouth at bedtime.    . fluticasone furoate-vilanterol (BREO ELLIPTA) 200-25 MCG/INH AEPB Inhale 1 puff into the lungs daily.    Marland Kitchen levETIRAcetam (KEPPRA) 500 MG tablet Take 1 tablet (500 mg total) by mouth 2 (two) times daily. 180 tablet 4  . loratadine (CLARITIN) 10 MG tablet Take 10 mg by mouth daily.    . mirtazapine (REMERON) 15 MG tablet Take 15 mg by mouth at bedtime.     . Multiple Vitamin (MULTIVITAMIN WITH MINERALS) TABS tablet Take 1 tablet by mouth daily.    Marland Kitchen omeprazole (PRILOSEC OTC) 20 MG tablet Take 20 mg by mouth daily as needed (stomach acid).    . OXYGEN Inhale 2-4 L into the lungs continuous.    . Probiotic Product (PROBIOTIC PO) Take 1 tablet by mouth daily.    . psyllium (METAMUCIL) 58.6 % packet Take 1 packet by mouth daily as needed (for  constipation).    . QUEtiapine (SEROQUEL) 100 MG tablet Take 1 tablet (100 mg total) by mouth at bedtime as needed (insominia). 30 tablet 0  . vitamin B-12 (CYANOCOBALAMIN) 500 MCG tablet Take 500 mcg by mouth daily.      No current facility-administered medications for this visit.     Allergies:   Ace inhibitors   Social History:  The patient  reports that he has quit smoking. His smoking use included cigarettes. He has a 3.50 pack-year smoking history. His smokeless tobacco use includes chew. He  reports that he does not drink alcohol or use drugs.   Family History:  The patient's family history includes Kidney failure in his sister; Stroke in his mother.    ROS:  Please see the history of present illness.   Otherwise, review of systems is positive for none.   All other systems are reviewed and negative.    PHYSICAL EXAM: VS:  BP 126/82   Pulse (!) 102   Ht 5\' 10"  (1.778 m)   Wt 138 lb (62.6 kg)   BMI 19.80 kg/m  , BMI Body mass index is 19.8 kg/m. GEN: Well nourished, well developed, in no acute distress  HEENT: normal  Neck: no JVD, carotid bruits, or masses Cardiac: RRR; no murmurs, rubs, or gallops,no edema  Respiratory:  clear to auscultation bilaterally, normal work of breathing GI: soft, nontender, nondistended, + BS MS: no deformity or atrophy  Skin: warm and dry, device pocket is well healed Neuro:  Strength and sensation are intact Psych: euthymic mood, full affect  EKG:  EKG is ordered today. Personal review of the ekg ordered shows SR, V paced  Device interrogation is reviewed today in detail.  See PaceArt for details.   Recent Labs: 05/22/2018: ALT 19; TSH 6.304 05/26/2018: Magnesium 2.1 06/20/2018: BUN 7; Creatinine, Ser 0.80; Hemoglobin 14.7; Platelets 217; Potassium 3.9; Sodium 139 06/21/2018: B Natriuretic Peptide 53.9    Lipid Panel     Component Value Date/Time   CHOL 142 05/23/2018 0032   TRIG 116 05/23/2018 0032   HDL 44 05/23/2018 0032   CHOLHDL 3.2 05/23/2018 0032   VLDL 23 05/23/2018 0032   LDLCALC 75 05/23/2018 0032     Wt Readings from Last 3 Encounters:  09/08/18 138 lb (62.6 kg)  08/02/18 132 lb 6.4 oz (60.1 kg)  07/04/18 130 lb (59 kg)      Other studies Reviewed: Additional studies/ records that were reviewed today include: TTE 06/21/18  Review of the above records today demonstrates:  - Left ventricle: The cavity size was normal. Systolic function was   normal. The estimated ejection fraction was in the range of  55%   to 60%. Wall motion was normal; there were no regional wall   motion abnormalities. - Ventricular septum: Septal motion showed mild dyssynergy and   &quot;bounce&quot;. - Atrial septum: No defect or patent foramen ovale was identified. - Pericardium, extracardiac: A trivial pericardial effusion was   identified.   ASSESSMENT AND PLAN:  1.  Complete heart block: Status post Medtronic dual-chamber pacemaker.  Device functioning appropriately.  Changes made to prolong battery life with switching him to MVP and decreasing RV outputs.  2.  Hypertension: Currently well controlled.  No changes.    Current medicines are reviewed at length with the patient today.   The patient does not have concerns regarding his medicines.  The following changes were made today:  none  Labs/ tests ordered today include:  Orders Placed This  Encounter  Procedures  . EKG 12-Lead     Disposition:   FU with Erienne Spelman 9 months  Signed, Deztinee Lohmeyer Jorja Loa, MD  09/08/2018 5:04 PM     [facility] [address] [place] [address] [phone] (office) [phone] (fax)

## 2018-09-12 DIAGNOSIS — R1310 Dysphagia, unspecified: Secondary | ICD-10-CM | POA: Diagnosis not present

## 2018-09-12 DIAGNOSIS — E860 Dehydration: Secondary | ICD-10-CM | POA: Diagnosis not present

## 2018-09-12 DIAGNOSIS — Z79899 Other long term (current) drug therapy: Secondary | ICD-10-CM | POA: Diagnosis not present

## 2018-09-15 ENCOUNTER — Ambulatory Visit: Payer: Medicare Other | Admitting: Physician Assistant

## 2018-09-15 ENCOUNTER — Encounter

## 2018-10-12 ENCOUNTER — Ambulatory Visit: Payer: Medicare Other | Admitting: Cardiology

## 2018-11-13 ENCOUNTER — Telehealth: Payer: Self-pay

## 2018-11-13 NOTE — Telephone Encounter (Signed)
Called and left detail message to let patient know about office visit will be a televisit. Advised a consent will be sent on his MyChart. Also let patient know to call the office with any questions

## 2018-11-21 ENCOUNTER — Telehealth: Payer: Medicare Other | Admitting: Cardiology

## 2018-12-11 ENCOUNTER — Ambulatory Visit (INDEPENDENT_AMBULATORY_CARE_PROVIDER_SITE_OTHER): Payer: Medicare Other | Admitting: *Deleted

## 2018-12-11 ENCOUNTER — Other Ambulatory Visit: Payer: Self-pay

## 2018-12-11 DIAGNOSIS — I442 Atrioventricular block, complete: Secondary | ICD-10-CM

## 2018-12-12 LAB — CUP PACEART REMOTE DEVICE CHECK
Battery Remaining Longevity: 148 mo
Battery Voltage: 3.06 V
Brady Statistic AP VP Percent: 0.01 %
Brady Statistic AP VS Percent: 1.16 %
Brady Statistic AS VP Percent: 0.15 %
Brady Statistic AS VS Percent: 98.69 %
Brady Statistic RA Percent Paced: 1.29 %
Brady Statistic RV Percent Paced: 0.15 %
Date Time Interrogation Session: 20200518033919
Implantable Lead Implant Date: 20191029
Implantable Lead Implant Date: 20191029
Implantable Lead Location: 753859
Implantable Lead Location: 753860
Implantable Lead Model: 3830
Implantable Lead Model: 5076
Implantable Pulse Generator Implant Date: 20191029
Lead Channel Impedance Value: 323 Ohm
Lead Channel Impedance Value: 342 Ohm
Lead Channel Impedance Value: 437 Ohm
Lead Channel Impedance Value: 475 Ohm
Lead Channel Pacing Threshold Amplitude: 0.625 V
Lead Channel Pacing Threshold Amplitude: 0.75 V
Lead Channel Pacing Threshold Pulse Width: 0.4 ms
Lead Channel Pacing Threshold Pulse Width: 0.4 ms
Lead Channel Sensing Intrinsic Amplitude: 11.125 mV
Lead Channel Sensing Intrinsic Amplitude: 11.125 mV
Lead Channel Sensing Intrinsic Amplitude: 2 mV
Lead Channel Sensing Intrinsic Amplitude: 2 mV
Lead Channel Setting Pacing Amplitude: 1.5 V
Lead Channel Setting Pacing Amplitude: 2.5 V
Lead Channel Setting Pacing Pulse Width: 0.4 ms
Lead Channel Setting Sensing Sensitivity: 1.2 mV

## 2018-12-19 ENCOUNTER — Encounter: Payer: Self-pay | Admitting: Cardiology

## 2018-12-19 NOTE — Progress Notes (Signed)
Remote pacemaker transmission.   

## 2018-12-20 ENCOUNTER — Telehealth: Payer: Medicare Other | Admitting: Cardiology

## 2018-12-25 DIAGNOSIS — J8410 Pulmonary fibrosis, unspecified: Secondary | ICD-10-CM | POA: Diagnosis not present

## 2018-12-25 DIAGNOSIS — H6983 Other specified disorders of Eustachian tube, bilateral: Secondary | ICD-10-CM | POA: Diagnosis not present

## 2018-12-27 ENCOUNTER — Other Ambulatory Visit: Payer: Self-pay

## 2018-12-27 ENCOUNTER — Telehealth (INDEPENDENT_AMBULATORY_CARE_PROVIDER_SITE_OTHER): Payer: Medicare Other | Admitting: Cardiology

## 2018-12-27 ENCOUNTER — Encounter: Payer: Self-pay | Admitting: Cardiology

## 2018-12-27 VITALS — BP 147/86 | HR 94 | Wt 142.0 lb

## 2018-12-27 DIAGNOSIS — I5022 Chronic systolic (congestive) heart failure: Secondary | ICD-10-CM

## 2018-12-27 DIAGNOSIS — I10 Essential (primary) hypertension: Secondary | ICD-10-CM

## 2018-12-27 DIAGNOSIS — I442 Atrioventricular block, complete: Secondary | ICD-10-CM

## 2018-12-27 DIAGNOSIS — I447 Left bundle-branch block, unspecified: Secondary | ICD-10-CM

## 2018-12-27 DIAGNOSIS — Z950 Presence of cardiac pacemaker: Secondary | ICD-10-CM

## 2018-12-27 NOTE — Patient Instructions (Signed)
Medication Instructions:  Your physician recommends that you continue on your current medications as directed. Please refer to the Current Medication list given to you today.  If you need a refill on your cardiac medications before your next appointment, please call your pharmacy.   Lab work: None.  If you have labs (blood work) drawn today and your tests are completely normal, you will receive your results only by: . MyChart Message (if you have MyChart) OR . A paper copy in the mail If you have any lab test that is abnormal or we need to change your treatment, we will call you to review the results.  Testing/Procedures: None.   Follow-Up: At [facility], you and your health needs are our priority.  As part of our continuing mission to provide you with exceptional heart care, we have created designated Provider Care Teams.  These Care Teams include your primary Cardiologist (physician) and Advanced Practice Providers (APPs -  Physician Assistants and Nurse Practitioners) who all work together to provide you with the care you need, when you need it. You will need a follow up appointment in 6 months.  Please call our office 2 months in advance to schedule this appointment.  You may see Robert Krasowski, MD or another member of our [facility] Provider Team in [place]: Brian Munley, MD . Rajan Revankar, MD  Any Other Special Instructions Will Be Listed Below (If Applicable).     

## 2018-12-27 NOTE — Progress Notes (Signed)
Virtual Visit via Video Note   This visit type was conducted due to national recommendations for restrictions regarding the COVID-19 Pandemic (e.g. social distancing) in an effort to limit this patient's exposure and mitigate transmission in our community.  Due to his co-morbid illnesses, this patient is at least at moderate risk for complications without adequate follow up.  This format is felt to be most appropriate for this patient at this time.  All issues noted in this document were discussed and addressed.  A limited physical exam was performed with this format.  Please refer to the patient's chart for his consent to telehealth for [facility].  Evaluation Performed:  Follow-up visit  This visit type was conducted due to national recommendations for restrictions regarding the COVID-19 Pandemic (e.g. social distancing).  This format is felt to be most appropriate for this patient at this time.  All issues noted in this document were discussed and addressed.  No physical exam was performed (except for noted visual exam findings with Video Visits).  Please refer to the patient's chart (MyChart message for video visits and phone note for telephone visits) for the patient's consent to telehealth for [facility]  Date:  12/27/2018  ID: CEDDRICK KRONICK, DOB 25-Sep-1940, MRN [id number]   Patient Location: [address] [place] [address]   Provider location:   [facility]  PCP:  Lise Auer, MD  Cardiologist:  Gypsy Balsam, MD     Chief Complaint: Doing well  History of Present Illness:    Dale Winters is a 78 y.o. male  who presents via audio/video conferencing for a telehealth visit today.  With left bundle branch block, transient complete heart block required pacemaker implantation, hypertension, idiopathic pulmonary fibrosis and talking to be reviewed He is doing well he sitting in the chair with oxygen etc. overall he is doing as well as he did about 6  months ago he gets short of breath quite easily which is undoubtedly related to his pulmonary fibrosis.  Denies having any swelling of lower extremities.  No palpitations no dizziness he said since he got his pacemaker implanted he is doing well   The patient does not have symptoms concerning for COVID-19 infection (fever, chills, cough, or new SHORTNESS OF BREATH).    Prior CV studies:   The following studies were reviewed today:       Past Medical History:  Diagnosis Date   Abnormal ECG    Appendicitis    1956   CHF (congestive heart failure) (HCC)    Complete heart block (HCC) 04/2018   a. noted 04/2018 with RBBB morphology with prior h/o LBBB - s/p Medtronic PPM 05/23/18, complicated by atrial lead microperforation felt due to seizure, with pericardial effusion requiring pericardiocentesis   Dilated cardiomyopathy (HCC)    a. prev EF 40-50%. b. EF 55-60% by echo 04/2018.   HTN (hypertension)    IPF (idiopathic pulmonary fibrosis) (HCC)    LBBB (left bundle branch block)    PAF (paroxysmal atrial fibrillation) (HCC)    a. noted during 10-05/2018 adm, not placed on anticoag at that time due to brief nature and recent pericardial effusion.   Pericardial effusion    S/P pericardiocentesis    Seizures (HCC)    a. dx in hospital 04/2018.    Past Surgical History:  Procedure Laterality Date   APPENDECTOMY     PACEMAKER IMPLANT N/A 05/23/2018   Procedure: PACEMAKER IMPLANT;  Surgeon: Lewayne Bunting  W, MD;  Location: [facility] INVASIVE CV LAB;  Service: Cardiovascular;  Laterality: N/A;   PERICARDIOCENTESIS N/A 05/24/2018   Procedure: PERICARDIOCENTESIS;  Surgeon: Iran Ouch, MD;  Location: [facility] INVASIVE CV LAB;  Service: Cardiovascular;  Laterality: N/A;     Current Meds  Medication Sig   albuterol (PROVENTIL HFA;VENTOLIN HFA) 108 (90 Base) MCG/ACT inhaler Inhale 1 puff into the lungs every 6 (six) hours as needed for wheezing or shortness of breath.    aspirin EC 81 MG tablet Take 81 mg by mouth at bedtime.   fluticasone furoate-vilanterol (BREO ELLIPTA) 200-25 MCG/INH AEPB Inhale 1 puff into the lungs daily.   levETIRAcetam (KEPPRA) 500 MG tablet Take 1 tablet (500 mg total) by mouth 2 (two) times daily.   loratadine (CLARITIN) 10 MG tablet Take 10 mg by mouth daily.   mirtazapine (REMERON) 15 MG tablet Take 15 mg by mouth at bedtime.    Multiple Vitamin (MULTIVITAMIN WITH MINERALS) TABS tablet Take 1 tablet by mouth daily.   omeprazole (PRILOSEC OTC) 20 MG tablet Take 20 mg by mouth daily as needed (stomach acid).   OXYGEN Inhale 2-4 L into the lungs continuous.   Probiotic Product (PROBIOTIC PO) Take 1 tablet by mouth daily.   vitamin B-12 (CYANOCOBALAMIN) 500 MCG tablet Take 500 mcg by mouth daily.       Family History: The patient's family history includes Kidney failure in his sister; Stroke in his mother.   ROS:   Please see the history of present illness.     All other systems reviewed and are negative.   Labs/Other Tests and Data Reviewed:     Recent Labs: 05/22/2018: ALT 19; TSH 6.304 05/26/2018: Magnesium 2.1 06/20/2018: BUN 7; Creatinine, Ser 0.80; Hemoglobin 14.7; Platelets 217; Potassium 3.9; Sodium 139 06/21/2018: B Natriuretic Peptide 53.9  Recent Lipid Panel    Component Value Date/Time   CHOL 142 05/23/2018 0032   TRIG 116 05/23/2018 0032   HDL 44 05/23/2018 0032   CHOLHDL 3.2 05/23/2018 0032   VLDL 23 05/23/2018 0032   LDLCALC 75 05/23/2018 0032      Exam:    Vital Signs:  BP (!) 147/86    Pulse 94    Wt 142 lb (64.4 kg)    SpO2 98%    BMI 20.37 kg/m     Wt Readings from Last 3 Encounters:  12/27/18 142 lb (64.4 kg)  09/08/18 138 lb (62.6 kg)  08/02/18 132 lb 6.4 oz (60.1 kg)     Well nourished, well developed in no acute distress. Alert awake oriented x3 happy to be able to talk to me via video link is somewhat hard of hearing therefore conversation somewhat difficult but he is  offering no complaints during the time of my interview  Diagnosis for this visit:   1. CHB (complete heart block) (HCC)   2. Left bundle branch block   3. Essential hypertension   4. Chronic systolic heart failure (HCC)   5. Pacemaker      ASSESSMENT & PLAN:    1.  Transient complete heart block that being addressed with dual-chamber pacemaker doing well recovered nicely after implantation doing well overall 2.  Left bundle branch block noted we will continue present management 3.  Essential hypertension blood pressure well controlled continue medications 4.  Congestive heart failure with ejection fraction of 45-50 which is related to left bundle branch block no signs and symptoms of decompensation. 5.  Pacemaker present interrogation reviewed he got more than  12 years left in the device normal function normal parameters.  COVID-19 Education: The signs and symptoms of COVID-19 were discussed with the patient and how to seek care for testing (follow up with PCP or arrange E-visit).  The importance of social distancing was discussed today.  Patient Risk:   After full review of this patients clinical status, I feel that they are at least moderate risk at this time.  Time:   Today, I have spent 18 minutes with the patient with telehealth technology discussing pt health issues.  I spent 5 minutes reviewing her chart before the visit.  Visit was finished at 11:41 AM.    Medication Adjustments/Labs and Tests Ordered: Current medicines are reviewed at length with the patient today.  Concerns regarding medicines are outlined above.  No orders of the defined types were placed in this encounter.  Medication changes: No orders of the defined types were placed in this encounter.    Disposition: Follow-up 6 months  Signed, Georgeanna Leaobert J. Demia Viera, MD, [facility] 12/27/2018 11:41 AM    [facility]

## 2019-01-31 ENCOUNTER — Ambulatory Visit: Payer: PRIVATE HEALTH INSURANCE | Admitting: Diagnostic Neuroimaging

## 2019-02-07 ENCOUNTER — Telehealth (INDEPENDENT_AMBULATORY_CARE_PROVIDER_SITE_OTHER): Payer: Medicare Other | Admitting: Diagnostic Neuroimaging

## 2019-02-07 ENCOUNTER — Encounter: Payer: Self-pay | Admitting: Diagnostic Neuroimaging

## 2019-02-07 DIAGNOSIS — G40909 Epilepsy, unspecified, not intractable, without status epilepticus: Secondary | ICD-10-CM

## 2019-02-07 MED ORDER — LEVETIRACETAM 500 MG PO TABS: 500.0000 mg | ORAL_TABLET | Freq: Two times a day (BID) | ORAL | 4 refills | Status: AC

## 2019-02-07 NOTE — Progress Notes (Signed)
    Virtual Visit via Video Note  I connected with Dale Winters on 02/07/19 at  2:00 PM EDT by a video enabled telemedicine application and verified that I am speaking with the correct person using two identifiers.   I discussed the limitations of evaluation and management by telemedicine and the availability of in person appointments. The patient expressed understanding and agreed to proceed.  Patient is at their home. I am at the office.    History of Present Illness:  UPDATE (02/07/19, VRP): Since last visit, doing well. Symptoms are stable. No more seizures. No alleviating or aggravating factors. Tolerating LEV 500mg  twice a day. Good appetite. Staying healthy.    PRIOR HPI 08/02/18:  78 year old male here for evaluation of seizure disorder.  Patient has been having intermittent episodes of "blanking out" with staring spells and unresponsiveness lasting for a few seconds, for the past 6 to 12 months.  In October 2019 patient was in the hospital for pacemaker placement.  Postoperatively patient had a generalized convulsive seizure with postictal confusion.  IV Keppra was ordered.  Patient then had a second seizure just before IV Keppra was started.  Patient was then transition to oral levetiracetam discharged home.  Since that time no further seizures.  Possibility of mild dementia was raised in the hospital.  Patient having some mild short-term memory problems for the past 1 year.  He has had some change in ADLs, mainly limited to his physical limitations.  No family history of seizure.  Patient is tolerating medication.  No side effects.    Observations/Objective:  - awake, alert - lang gluent; comprehension intact - face symm - edentulous - moves arms symm   Assessment and Plan:  78 y.o. male with new onset of seizure disorder starting in 2019.  Now stable on levetiracetam.  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  Dx:  1. Seizure disorder ([place])      - continue levetiracetam 500mg  twice a day  - seizure precautions reviewed   Follow Up Instructions:  - Return in about 1 year (around 02/07/2020).    I discussed the assessment and treatment plan with the patient. The patient was provided an opportunity to ask questions and all were answered. The patient agreed with the plan and demonstrated an understanding of the instructions.   The patient was advised to call back or seek an in-person evaluation if the symptoms worsen or if the condition fails to improve as anticipated.  I provided 15 minutes of non-face-to-face time during this encounter.   Penni Bombard, MD 5/46/5035, 4:65 PM Certified in Neurology, Neurophysiology and Neuroimaging  [facility] [address], [place], [place] [address] [phone]

## 2019-02-08 DIAGNOSIS — H906 Mixed conductive and sensorineural hearing loss, bilateral: Secondary | ICD-10-CM | POA: Diagnosis not present

## 2019-02-08 DIAGNOSIS — H9193 Unspecified hearing loss, bilateral: Secondary | ICD-10-CM | POA: Diagnosis not present

## 2019-02-08 DIAGNOSIS — H9190 Unspecified hearing loss, unspecified ear: Secondary | ICD-10-CM | POA: Diagnosis not present

## 2019-02-08 DIAGNOSIS — H6122 Impacted cerumen, left ear: Secondary | ICD-10-CM | POA: Diagnosis not present

## 2019-02-26 ENCOUNTER — Telehealth: Payer: Self-pay | Admitting: Cardiology

## 2019-02-26 NOTE — Telephone Encounter (Signed)
I explained to the daughter all she have to do is hold the button for 2 seconds and the pt will have to be within 6 ft of the monitor for it to send. I told her the pt monitor is set up to send by itself as long as it plugged into by the pt bedside. She going to try to send a transmission today. I told her he is not due for a transmission until the 03/13/2019. I told her I will keep an eye out for the transmission. The pt daughter thanked me for the call.

## 2019-02-26 NOTE — Telephone Encounter (Signed)
New message   Patient's daughter would like to know how the remote transmission works. Please call.

## 2019-02-26 NOTE — Telephone Encounter (Signed)
Transmission received 02/26/19. No episodes noted since last transmission on 12/10/18. Lead trends stable. Next transmission on 03/13/19 should be received automatically. Encounter closed.

## 2019-03-13 ENCOUNTER — Ambulatory Visit (INDEPENDENT_AMBULATORY_CARE_PROVIDER_SITE_OTHER): Payer: Medicare Other | Admitting: *Deleted

## 2019-03-13 DIAGNOSIS — I442 Atrioventricular block, complete: Secondary | ICD-10-CM

## 2019-03-13 LAB — CUP PACEART REMOTE DEVICE CHECK
Battery Remaining Longevity: 153 mo
Battery Voltage: 3.04 V
Brady Statistic AP VP Percent: 0.01 %
Brady Statistic AP VS Percent: 0.43 %
Brady Statistic AS VP Percent: 0.04 %
Brady Statistic AS VS Percent: 99.52 %
Brady Statistic RA Percent Paced: 0.56 %
Brady Statistic RV Percent Paced: 0.05 %
Date Time Interrogation Session: 20200818024312
Implantable Lead Implant Date: 20191029
Implantable Lead Implant Date: 20191029
Implantable Lead Location: 753859
Implantable Lead Location: 753860
Implantable Lead Model: 3830
Implantable Lead Model: 5076
Implantable Pulse Generator Implant Date: 20191029
Lead Channel Impedance Value: 304 Ohm
Lead Channel Impedance Value: 361 Ohm
Lead Channel Impedance Value: 437 Ohm
Lead Channel Impedance Value: 513 Ohm
Lead Channel Pacing Threshold Amplitude: 0.75 V
Lead Channel Pacing Threshold Amplitude: 0.875 V
Lead Channel Pacing Threshold Pulse Width: 0.4 ms
Lead Channel Pacing Threshold Pulse Width: 0.4 ms
Lead Channel Sensing Intrinsic Amplitude: 11.75 mV
Lead Channel Sensing Intrinsic Amplitude: 11.75 mV
Lead Channel Sensing Intrinsic Amplitude: 2.25 mV
Lead Channel Sensing Intrinsic Amplitude: 2.25 mV
Lead Channel Setting Pacing Amplitude: 1.75 V
Lead Channel Setting Pacing Amplitude: 2.5 V
Lead Channel Setting Pacing Pulse Width: 0.4 ms
Lead Channel Setting Sensing Sensitivity: 1.2 mV

## 2019-03-21 ENCOUNTER — Encounter: Payer: Self-pay | Admitting: Cardiology

## 2019-03-21 NOTE — Progress Notes (Signed)
Remote pacemaker transmission.   

## 2019-04-05 DIAGNOSIS — Z23 Encounter for immunization: Secondary | ICD-10-CM | POA: Diagnosis not present

## 2019-04-10 ENCOUNTER — Telehealth: Payer: Self-pay | Admitting: Cardiology

## 2019-04-10 DIAGNOSIS — I5022 Chronic systolic (congestive) heart failure: Secondary | ICD-10-CM

## 2019-04-10 DIAGNOSIS — I10 Essential (primary) hypertension: Secondary | ICD-10-CM

## 2019-04-10 NOTE — Telephone Encounter (Signed)
Called and spoke to patient's daughter. She reports patient has been having feet, ankle and now toe swelling for 1 week. He doesn't have any new shortness of breath (he wears oxygen anyways), no redness, no pain, no warmth. Only continuous swelling that has gotten worse within last week. He doesn't take a diuretic daughter wants Dr. Wendy Poet recommendation.

## 2019-04-10 NOTE — Telephone Encounter (Signed)
Patients daughter called and concenred about his feet and ankles swelling and now in toes.  She has not gained much weight.. She called PCP and they advised her to call us.Marland Kitchen

## 2019-04-11 DIAGNOSIS — I5022 Chronic systolic (congestive) heart failure: Secondary | ICD-10-CM | POA: Diagnosis not present

## 2019-04-11 DIAGNOSIS — I10 Essential (primary) hypertension: Secondary | ICD-10-CM | POA: Diagnosis not present

## 2019-04-11 LAB — BASIC METABOLIC PANEL
BUN/Creatinine Ratio: 10 (ref 10–24)
BUN: 8 mg/dL (ref 8–27)
CO2: 26 mmol/L (ref 20–29)
Calcium: 9.8 mg/dL (ref 8.6–10.2)
Chloride: 99 mmol/L (ref 96–106)
Creatinine, Ser: 0.79 mg/dL (ref 0.76–1.27)
GFR calc Af Amer: 99 mL/min/{1.73_m2} (ref >59)
GFR calc non Af Amer: 85 mL/min/{1.73_m2} (ref >59)
Glucose: 92 mg/dL (ref 65–99)
Potassium: 3.4 mmol/L — ABNORMAL LOW (ref 3.5–5.2)
Sodium: 142 mmol/L (ref 134–144)

## 2019-04-11 LAB — PRO B NATRIURETIC PEPTIDE: NT-Pro BNP: 164 pg/mL (ref 0–486)

## 2019-04-11 MED ORDER — FUROSEMIDE 20 MG PO TABS: 20.0000 mg | ORAL_TABLET | Freq: Every day | ORAL | 1 refills | Status: DC

## 2019-04-11 NOTE — Addendum Note (Signed)
Addended by: Ashok Norris on: 04/11/2019 08:48 AM   Modules accepted: Orders

## 2019-04-11 NOTE — Telephone Encounter (Signed)
Start lasix 20 mg po qd  chem7 and proBNP needs to be done

## 2019-04-11 NOTE — Telephone Encounter (Signed)
Daughter called back this am and is really concerned and they are  Worse swollen today

## 2019-04-11 NOTE — Telephone Encounter (Signed)
Patient daughter informed of results a recommendations. Patient will have labs drawn today and will pick up lasix 20 mg daily as well.

## 2019-04-12 ENCOUNTER — Telehealth: Payer: Self-pay | Admitting: Emergency Medicine

## 2019-04-12 DIAGNOSIS — I10 Essential (primary) hypertension: Secondary | ICD-10-CM

## 2019-04-12 DIAGNOSIS — I5022 Chronic systolic (congestive) heart failure: Secondary | ICD-10-CM

## 2019-04-12 MED ORDER — POTASSIUM CHLORIDE ER 10 MEQ PO TBCR: 10.0000 meq | EXTENDED_RELEASE_TABLET | Freq: Every day | ORAL | 1 refills | Status: DC

## 2019-04-12 NOTE — Telephone Encounter (Signed)
Notes recorded by Park Liter, MD on 04/12/2019 at 8:27 AM EDT  No evidence of chf but potassium low  Add kcl 10 meq po ad  chem7 in 1 week  Informed daughter of results and pot has been sent to pharmacy and they know to get chem 7 in 1 week/ will go to lab in [place]

## 2019-04-18 DIAGNOSIS — I5022 Chronic systolic (congestive) heart failure: Secondary | ICD-10-CM | POA: Diagnosis not present

## 2019-04-18 DIAGNOSIS — I10 Essential (primary) hypertension: Secondary | ICD-10-CM | POA: Diagnosis not present

## 2019-04-19 LAB — BASIC METABOLIC PANEL
BUN/Creatinine Ratio: 13 (ref 10–24)
BUN: 10 mg/dL (ref 8–27)
CO2: 30 mmol/L — ABNORMAL HIGH (ref 20–29)
Calcium: 9.9 mg/dL (ref 8.6–10.2)
Chloride: 100 mmol/L (ref 96–106)
Creatinine, Ser: 0.75 mg/dL — ABNORMAL LOW (ref 0.76–1.27)
GFR calc Af Amer: 101 mL/min/{1.73_m2} (ref >59)
GFR calc non Af Amer: 87 mL/min/{1.73_m2} (ref >59)
Glucose: 112 mg/dL — ABNORMAL HIGH (ref 65–99)
Potassium: 4.3 mmol/L (ref 3.5–5.2)
Sodium: 144 mmol/L (ref 134–144)

## 2019-06-04 ENCOUNTER — Other Ambulatory Visit: Payer: Self-pay

## 2019-06-04 ENCOUNTER — Ambulatory Visit (INDEPENDENT_AMBULATORY_CARE_PROVIDER_SITE_OTHER): Payer: Medicare Other | Admitting: Cardiology

## 2019-06-04 ENCOUNTER — Encounter: Payer: Self-pay | Admitting: Cardiology

## 2019-06-04 VITALS — BP 128/72 | HR 109 | Ht 70.0 in | Wt 145.0 lb

## 2019-06-04 DIAGNOSIS — I442 Atrioventricular block, complete: Secondary | ICD-10-CM

## 2019-06-04 NOTE — Patient Instructions (Signed)
Medication Instructions:  Your physician recommends that you continue on your current medications as directed. Please refer to the Current Medication list given to you today.  *If you need a refill on your cardiac medications before your next appointment, please call your pharmacy*  Labwork: None ordered  Testing/Procedures: None ordered  Follow-Up: Remote monitoring is used to monitor your Pacemaker or ICD from home. This monitoring reduces the number of office visits required to check your device to one time per year. It allows [place] to keep an eye on the functioning of your device to ensure it is working properly. You are scheduled for a device check from home on 06/12/19 & 09/11/2019 . You may send your transmission at any time that day. If you have a wireless device, the transmission will be sent automatically. After your physician reviews your transmission, you will receive a postcard with your next transmission date.  Your physician wants you to follow-up in: 1 year with Dr. Curt Bears. You will receive a reminder letter in the mail two months in advance. If you don't receive a letter, please call our office to schedule the follow-up appointment.   Thank you for choosing [facility]!!   Trinidad Curet, RN [phone]  Any Other Special Instructions Will Be Listed Below (If Applicable).

## 2019-06-04 NOTE — Progress Notes (Signed)
Electrophysiology Office Note   Date:  06/04/2019   ID:  Dale Winters, DOB October 05, 1940, MRN [id number]  PCP:  Dale Auer, MD  Cardiologist:  Dale Winters Primary Electrophysiologist:  Dale Kissick Jorja Loa, MD    No chief complaint on file.    History of Present Illness: Dale Winters is a 78 y.o. male who is being seen today for the evaluation of complete heart block at the request of Dale Auer, MD. Presenting today for electrophysiology evaluation.  He has a history of complete heart block status is to Medtronic dual-chamber pacemaker, paroxysmal atrial fibrillation, idiopathic pulmonary fibrosis on home oxygen.  Pacemaker was implanted 06/23/2018.  He had a seizure shortly thereafter causing cardiac tamponade requiring pericardiocentesis.  Today, denies symptoms of palpitations, chest pain, shortness of breath, orthopnea, PND, lower extremity edema, claudication, dizziness, presyncope, syncope, bleeding, or neurologic sequela. The patient is tolerating medications without difficulties.    Past Medical History:  Diagnosis Date  . Abnormal ECG   . Appendicitis    1956  . CHF (congestive heart failure) (HCC)   . Complete heart block (HCC) 04/2018   a. noted 04/2018 with RBBB morphology with prior h/o LBBB - s/p Medtronic PPM 05/23/18, complicated by atrial lead microperforation felt due to seizure, with pericardial effusion requiring pericardiocentesis  . Dilated cardiomyopathy (HCC)    a. prev EF 40-50%. b. EF 55-60% by echo 04/2018.  Marland Kitchen HTN (hypertension)   . IPF (idiopathic pulmonary fibrosis) (HCC)   . LBBB (left bundle branch block)   . PAF (paroxysmal atrial fibrillation) (HCC)    a. noted during 10-05/2018 adm, not placed on anticoag at that time due to brief nature and recent pericardial effusion.  . Pericardial effusion   . S/P pericardiocentesis   . Seizures (HCC)    a. dx in hospital 04/2018.   Past Surgical History:  Procedure Laterality Date  . APPENDECTOMY     . PACEMAKER IMPLANT N/A 05/23/2018   Procedure: PACEMAKER IMPLANT;  Surgeon: Dale Maw, MD;  Location: [facility] INVASIVE CV LAB;  Service: Cardiovascular;  Laterality: N/A;  . PERICARDIOCENTESIS N/A 05/24/2018   Procedure: PERICARDIOCENTESIS;  Surgeon: Dale Ouch, MD;  Location: [facility] INVASIVE CV LAB;  Service: Cardiovascular;  Laterality: N/A;     Current Outpatient Medications  Medication Sig Dispense Refill  . albuterol (PROVENTIL HFA;VENTOLIN HFA) 108 (90 Base) MCG/ACT inhaler Inhale 1 puff into the lungs every 6 (six) hours as needed for wheezing or shortness of breath.    Marland Kitchen aspirin EC 81 MG tablet Take 81 mg by mouth at bedtime.    . furosemide (LASIX) 20 MG tablet Take 1 tablet (20 mg total) by mouth daily. 90 tablet 1  . levETIRAcetam (KEPPRA) 500 MG tablet Take 1 tablet (500 mg total) by mouth 2 (two) times daily. 180 tablet 4  . loratadine (CLARITIN) 10 MG tablet Take 10 mg by mouth daily.    . mirtazapine (REMERON) 15 MG tablet Take 15 mg by mouth at bedtime.     . Multiple Vitamin (MULTIVITAMIN WITH MINERALS) TABS tablet Take 1 tablet by mouth daily.    Marland Kitchen omeprazole (PRILOSEC OTC) 20 MG tablet Take 20 mg by mouth daily as needed (stomach acid).    . OXYGEN Inhale 2-4 L into the lungs continuous.    . potassium chloride (K-DUR) 10 MEQ tablet Take 1 tablet (10 mEq total) by mouth daily. 90 tablet 1  . Probiotic Product (PROBIOTIC PO) Take 1 tablet by mouth daily.    Marland Kitchen  vitamin B-12 (CYANOCOBALAMIN) 500 MCG tablet Take 500 mcg by mouth daily.      No current facility-administered medications for this visit.     Allergies:   Ace inhibitors   Social History:  The patient  reports that he has quit smoking. His smoking use included cigarettes. He has a 3.50 pack-year smoking history. His smokeless tobacco use includes chew. He reports that he does not drink alcohol or use drugs.   Family History:  The patient's family history includes Kidney failure in his sister; Stroke in his  mother.   ROS:  Please see the history of present illness.   Otherwise, review of systems is positive for none.   All other systems are reviewed and negative.   PHYSICAL EXAM: VS:  BP 128/72   Pulse (!) 109   Ht 5\' 10"  (1.778 m)   Wt 145 lb (65.8 kg)   SpO2 98%   BMI 20.81 kg/m  , BMI Body mass index is 20.81 kg/m. GEN: Well nourished, well developed, in no acute distress  HEENT: normal  Neck: no JVD, carotid bruits, or masses Cardiac: RRR; no murmurs, rubs, or gallops,no edema  Respiratory:  clear to auscultation bilaterally, normal work of breathing GI: soft, nontender, nondistended, + BS MS: no deformity or atrophy  Skin: warm and dry, device site well healed Neuro:  Strength and sensation are intact Psych: euthymic mood, full affect  EKG:  EKG is ordered today. Personal review of the ekg ordered shows sinus tachycardia, rate 109, inferior infarct, IVCD  Personal review of the device interrogation today. Results in [place]: 06/20/2018: Hemoglobin 14.7; Platelets 217 06/21/2018: B Natriuretic Peptide 53.9 04/11/2019: NT-Pro BNP 164 04/18/2019: BUN 10; Creatinine, Ser 0.75; Potassium 4.3; Sodium 144    Lipid Panel     Component Value Date/Time   CHOL 142 05/23/2018 0032   TRIG 116 05/23/2018 0032   HDL 44 05/23/2018 0032   CHOLHDL 3.2 05/23/2018 0032   VLDL 23 05/23/2018 0032   LDLCALC 75 05/23/2018 0032     Wt Readings from Last 3 Encounters:  06/04/19 145 lb (65.8 kg)  12/27/18 142 lb (64.4 kg)  09/08/18 138 lb (62.6 kg)      Other studies Reviewed: Additional studies/ records that were reviewed today include: TTE 06/21/18  Review of the above records today demonstrates:  - Left ventricle: The cavity size was normal. Systolic function was   normal. The estimated ejection fraction was in the range of 55%   to 60%. Wall motion was normal; there were no regional wall   motion abnormalities. - Ventricular septum: Septal motion showed mild  dyssynergy and   &quot;bounce&quot;. - Atrial septum: No defect or patent foramen ovale was identified. - Pericardium, extracardiac: A trivial pericardial effusion was   identified.   ASSESSMENT AND PLAN:  1.  Complete heart block: Status post Medtronic dual-chamber pacemaker.  Was switched to MVP pacing last visit.  Device functioning appropriately.  No changes.  2.  Hypertension: Well-controlled    Current medicines are reviewed at length with the patient today.   The patient does not have concerns regarding his medicines.  The following changes were made today: None  Labs/ tests ordered today include:  Orders Placed This Encounter  Procedures  . EKG 12-Lead     Disposition:   FU with Heavan Francom 12 months  Signed, Vona Whiters Meredith Leeds, MD  06/04/2019 11:29 AM     [address]  [address] [place] [address] [phone] (office) [phone] (fax)

## 2019-06-12 ENCOUNTER — Ambulatory Visit (INDEPENDENT_AMBULATORY_CARE_PROVIDER_SITE_OTHER): Payer: Medicare Other | Admitting: *Deleted

## 2019-06-12 DIAGNOSIS — I480 Paroxysmal atrial fibrillation: Secondary | ICD-10-CM | POA: Diagnosis not present

## 2019-06-12 DIAGNOSIS — I447 Left bundle-branch block, unspecified: Secondary | ICD-10-CM

## 2019-06-13 LAB — CUP PACEART REMOTE DEVICE CHECK
Battery Remaining Longevity: 154 mo
Battery Voltage: 3.03 V
Brady Statistic AP VP Percent: 0.01 %
Brady Statistic AP VS Percent: 0.4 %
Brady Statistic AS VP Percent: 0.03 %
Brady Statistic AS VS Percent: 99.56 %
Brady Statistic RA Percent Paced: 0.52 %
Brady Statistic RV Percent Paced: 0.04 %
Date Time Interrogation Session: 20201117043847
Implantable Lead Implant Date: 20191029
Implantable Lead Implant Date: 20191029
Implantable Lead Location: 753859
Implantable Lead Location: 753860
Implantable Lead Model: 3830
Implantable Lead Model: 5076
Implantable Pulse Generator Implant Date: 20191029
Lead Channel Impedance Value: 323 Ohm
Lead Channel Impedance Value: 380 Ohm
Lead Channel Impedance Value: 456 Ohm
Lead Channel Impedance Value: 551 Ohm
Lead Channel Pacing Threshold Amplitude: 0.75 V
Lead Channel Pacing Threshold Amplitude: 1 V
Lead Channel Pacing Threshold Pulse Width: 0.4 ms
Lead Channel Pacing Threshold Pulse Width: 0.4 ms
Lead Channel Sensing Intrinsic Amplitude: 12 mV
Lead Channel Sensing Intrinsic Amplitude: 12 mV
Lead Channel Sensing Intrinsic Amplitude: 2.125 mV
Lead Channel Sensing Intrinsic Amplitude: 2.125 mV
Lead Channel Setting Pacing Amplitude: 2 V
Lead Channel Setting Pacing Amplitude: 2.5 V
Lead Channel Setting Pacing Pulse Width: 0.4 ms
Lead Channel Setting Sensing Sensitivity: 1.2 mV

## 2019-07-02 ENCOUNTER — Ambulatory Visit: Payer: Medicare Other | Admitting: Cardiology

## 2019-07-09 ENCOUNTER — Ambulatory Visit (INDEPENDENT_AMBULATORY_CARE_PROVIDER_SITE_OTHER): Payer: Medicare Other | Admitting: Cardiology

## 2019-07-09 ENCOUNTER — Encounter: Payer: Self-pay | Admitting: Cardiology

## 2019-07-09 ENCOUNTER — Other Ambulatory Visit: Payer: Self-pay

## 2019-07-09 VITALS — BP 100/50 | HR 59 | Ht 70.0 in | Wt 140.0 lb

## 2019-07-09 DIAGNOSIS — I10 Essential (primary) hypertension: Secondary | ICD-10-CM | POA: Diagnosis not present

## 2019-07-09 DIAGNOSIS — I420 Dilated cardiomyopathy: Secondary | ICD-10-CM | POA: Diagnosis not present

## 2019-07-09 DIAGNOSIS — I442 Atrioventricular block, complete: Secondary | ICD-10-CM | POA: Diagnosis not present

## 2019-07-09 DIAGNOSIS — R0600 Dyspnea, unspecified: Secondary | ICD-10-CM

## 2019-07-09 DIAGNOSIS — Z950 Presence of cardiac pacemaker: Secondary | ICD-10-CM

## 2019-07-09 NOTE — Progress Notes (Signed)
Remote pacemaker transmission.   

## 2019-07-09 NOTE — Progress Notes (Signed)
Cardiology Office Note:    Date:  07/09/2019   ID:  Dale Winters, DOB November 21, 1940, MRN [id number]  PCP:  Dale Flow, MD  Cardiologist:  Dale Campus, MD    Referring MD: Dale Flow, MD   Chief Complaint  Patient presents with  . Follow-up  Doing fair  History of Present Illness:    Dale Winters is a 78 y.o. male with past medical history significant for complete heart block, cardiomyopathy, pulmonary fibrosis he comes today to my office for follow-up overall doing poorly complain of getting worse minimal effort will give him shortness of breath there is no swelling of the lower extremities.  Past Medical History:  Diagnosis Date  . Abnormal ECG   . Appendicitis    1956  . CHF (congestive heart failure) ([place])   . Complete heart block ([place]) 04/2018   a. noted 04/2018 with RBBB morphology with prior h/o LBBB - s/p Medtronic PPM 29/52/84, complicated by atrial lead microperforation felt due to seizure, with pericardial effusion requiring pericardiocentesis  . Dilated cardiomyopathy ([place])    a. prev EF 40-50%. b. EF 55-60% by echo 04/2018.  Marland Kitchen HTN (hypertension)   . IPF (idiopathic pulmonary fibrosis) ([place])   . LBBB (left bundle branch block)   . PAF (paroxysmal atrial fibrillation) ([place])    a. noted during 10-05/2018 adm, not placed on anticoag at that time due to brief nature and recent pericardial effusion.  . Pericardial effusion   . S/P pericardiocentesis   . Seizures ([place])    a. dx in hospital 04/2018.    Past Surgical History:  Procedure Laterality Date  . APPENDECTOMY    . PACEMAKER IMPLANT N/A 05/23/2018   Procedure: PACEMAKER IMPLANT;  Surgeon: Evans Lance, MD;  Location: [place] CV LAB;  Service: Cardiovascular;  Laterality: N/A;  . PERICARDIOCENTESIS N/A 05/24/2018   Procedure: PERICARDIOCENTESIS;  Surgeon: Wellington Hampshire, MD;  Location: [place] CV LAB;  Service: Cardiovascular;  Laterality: N/A;    Current Medications: Current Meds    Medication Sig  . albuterol (PROVENTIL HFA;VENTOLIN HFA) 108 (90 Base) MCG/ACT inhaler Inhale 1 puff into the lungs every 6 (six) hours as needed for wheezing or shortness of breath.  Marland Kitchen aspirin EC 81 MG tablet Take 81 mg by mouth at bedtime.  . budesonide-formoterol (SYMBICORT) 160-4.5 MCG/ACT inhaler Inhale 2 puffs into the lungs 2 (two) times daily.  . furosemide (LASIX) 20 MG tablet Take 1 tablet (20 mg total) by mouth daily.  Marland Kitchen levETIRAcetam (KEPPRA) 500 MG tablet Take 1 tablet (500 mg total) by mouth 2 (two) times daily.  Marland Kitchen loratadine (CLARITIN) 10 MG tablet Take 10 mg by mouth daily.  . mirtazapine (REMERON) 15 MG tablet Take 15 mg by mouth at bedtime.   . Multiple Vitamin (MULTIVITAMIN WITH MINERALS) TABS tablet Take 1 tablet by mouth daily.  Marland Kitchen omeprazole (PRILOSEC OTC) 20 MG tablet Take 20 mg by mouth daily as needed (stomach acid).  . OXYGEN Inhale 2-4 L into the lungs continuous.  . potassium chloride (K-DUR) 10 MEQ tablet Take 1 tablet (10 mEq total) by mouth daily.  . Probiotic Product (PROBIOTIC PO) Take 1 tablet by mouth daily.  . vitamin B-12 (CYANOCOBALAMIN) 500 MCG tablet Take 500 mcg by mouth daily.      Allergies:   Ace inhibitors   Social History   Socioeconomic History  . Marital status: Married    Spouse name: Not on file  . Number of children: Not  on file  . Years of education: Not on file  . Highest education level: Not on file  Occupational History  . Not on file  Tobacco Use  . Smoking status: Former Smoker    Packs/day: 0.70    Years: 5.00    Pack years: 3.50    Types: Cigarettes  . Smokeless tobacco: Current User    Types: Chew  Substance and Sexual Activity  . Alcohol use: No    Alcohol/week: 0.0 standard drinks  . Drug use: No  . Sexual activity: Not on file  Other Topics Concern  . Not on file  Social History Narrative   Lives home with spouse.  Is retired.  Education HS/ Trade school.  Children 3.  Daughter Synetta Fail here today.   Drinks  decaff tea, coffee.     Social Determinants of Health   Financial Resource Strain:   . Difficulty of Paying Living Expenses: Not on file  Food Insecurity:   . Worried About Programme researcher, broadcasting/film/video in the Last Year: Not on file  . Ran Out of Food in the Last Year: Not on file  Transportation Needs:   . Lack of Transportation (Medical): Not on file  . Lack of Transportation (Non-Medical): Not on file  Physical Activity:   . Days of Exercise per Week: Not on file  . Minutes of Exercise per Session: Not on file  Stress:   . Feeling of Stress : Not on file  Social Connections:   . Frequency of Communication with Friends and Family: Not on file  . Frequency of Social Gatherings with Friends and Family: Not on file  . Attends Religious Services: Not on file  . Active Member of Clubs or Organizations: Not on file  . Attends Banker Meetings: Not on file  . Marital Status: Not on file     Family History: The patient's family history includes Kidney failure in his sister; Stroke in his mother. ROS:   Please see the history of present illness.    All 14 point review of systems negative except as described per history of present illness  EKGs/Labs/Other Studies Reviewed:      Recent Labs: 04/11/2019: NT-Pro BNP 164 04/18/2019: BUN 10; Creatinine, Ser 0.75; Potassium 4.3; Sodium 144  Recent Lipid Panel    Component Value Date/Time   CHOL 142 05/23/2018 0032   TRIG 116 05/23/2018 0032   HDL 44 05/23/2018 0032   CHOLHDL 3.2 05/23/2018 0032   VLDL 23 05/23/2018 0032   LDLCALC 75 05/23/2018 0032    Physical Exam:    VS:  BP (!) 100/50   Pulse (!) 59   Ht 5\' 10"  (1.778 m)   Wt 140 lb (63.5 kg) Comment: Reported  SpO2 96% Comment: 3lt of O2  BMI 20.09 kg/m     Wt Readings from Last 3 Encounters:  07/09/19 140 lb (63.5 kg)  06/04/19 145 lb (65.8 kg)  12/27/18 142 lb (64.4 kg)     GEN:  Well nourished, well developed in no acute distress HEENT: Normal NECK: No  JVD; No carotid bruits LYMPHATICS: No lymphadenopathy CARDIAC: RRR, no murmurs, no rubs, no gallops RESPIRATORY:  Clear to auscultation without rales, wheezing or rhonchi  ABDOMEN: Soft, non-tender, non-distended MUSCULOSKELETAL:  No edema; No deformity  SKIN: Warm and dry LOWER EXTREMITIES: no swelling NEUROLOGIC:  Alert and oriented x 3 PSYCHIATRIC:  Normal affect   ASSESSMENT:    1. Dyspnea, unspecified type   2. Transient complete heart  block (HCC)   3. Essential hypertension   4. Dilated cardiomyopathy (HCC)   5. Pacemaker    PLAN:    In order of problems listed above:  1. Dyspnea on exertion which is related probably to pulmonary fibrosis, I will schedule him to have an echocardiogram to reassess his left ventricle ejection fraction. 2. Essential hypertension doing well from that point review blood pressures well controlled 3. Dilated cardiomyopathy again will do echocardiogram. 4. Pacemaker present noted interrogation reviewed normal function   Medication Adjustments/Labs and Tests Ordered: Current medicines are reviewed at length with the patient today.  Concerns regarding medicines are outlined above.  Orders Placed This Encounter  Procedures  . ECHOCARDIOGRAM COMPLETE   Medication changes: No orders of the defined types were placed in this encounter.   Signed, Georgeanna Lea, MD, [facility] 07/09/2019 4:21 PM    [facility]

## 2019-07-09 NOTE — Patient Instructions (Addendum)
Medication Instructions:  Your physician recommends that you continue on your current medications as directed. Please refer to the Current Medication list given to you today.  *If you need a refill on your cardiac medications before your next appointment, please call your pharmacy*  Lab Work: None.  If you have labs (blood work) drawn today and your tests are completely normal, you will receive your results only by: . MyChart Message (if you have MyChart) OR . A paper copy in the mail If you have any lab test that is abnormal or we need to change your treatment, we will call you to review the results.  Testing/Procedures: Your physician has requested that you have an echocardiogram. Echocardiography is a painless test that uses sound waves to create images of your heart. It provides your doctor with information about the size and shape of your heart and how well your heart's chambers and valves are working. This procedure takes approximately one hour. There are no restrictions for this procedure.    Follow-Up: At [facility], you and your health needs are our priority.  As part of our continuing mission to provide you with exceptional heart care, we have created designated Provider Care Teams.  These Care Teams include your primary Cardiologist (physician) and Advanced Practice Providers (APPs -  Physician Assistants and Nurse Practitioners) who all work together to provide you with the care you need, when you need it.  Your next appointment:   6 months  The format for your next appointment:   In Person  Provider:   Robert Krasowski, MD  Other Instructions   Echocardiogram An echocardiogram is a procedure that uses painless sound waves (ultrasound) to produce an image of the heart. Images from an echocardiogram can provide important information about:  Signs of coronary artery disease (CAD).  Aneurysm detection. An aneurysm is a weak or damaged part of an artery wall that  bulges out from the normal force of blood pumping through the body.  Heart size and shape. Changes in the size or shape of the heart can be associated with certain conditions, including heart failure, aneurysm, and CAD.  Heart muscle function.  Heart valve function.  Signs of a past heart attack.  Fluid buildup around the heart.  Thickening of the heart muscle.  A tumor or infectious growth around the heart valves. Tell a health care provider about:  Any allergies you have.  All medicines you are taking, including vitamins, herbs, eye drops, creams, and over-the-counter medicines.  Any blood disorders you have.  Any surgeries you have had.  Any medical conditions you have.  Whether you are pregnant or may be pregnant. What are the risks? Generally, this is a safe procedure. However, problems may occur, including:  Allergic reaction to dye (contrast) that may be used during the procedure. What happens before the procedure? No specific preparation is needed. You may eat and drink normally. What happens during the procedure?   An IV tube may be inserted into one of your veins.  You may receive contrast through this tube. A contrast is an injection that improves the quality of the pictures from your heart.  A gel will be applied to your chest.  A wand-like tool (transducer) will be moved over your chest. The gel will help to transmit the sound waves from the transducer.  The sound waves will harmlessly bounce off of your heart to allow the heart images to be captured in real-time motion. The images will be recorded   on a computer. The procedure may vary among health care providers and hospitals. What happens after the procedure?  You may return to your normal, everyday life, including diet, activities, and medicines, unless your health care provider tells you not to do that. Summary  An echocardiogram is a procedure that uses painless sound waves (ultrasound) to produce  an image of the heart.  Images from an echocardiogram can provide important information about the size and shape of your heart, heart muscle function, heart valve function, and fluid buildup around your heart.  You do not need to do anything to prepare before this procedure. You may eat and drink normally.  After the echocardiogram is completed, you may return to your normal, everyday life, unless your health care provider tells you not to do that. This information is not intended to replace advice given to you by your health care provider. Make sure you discuss any questions you have with your health care provider. Document Released: 07/09/2000 Document Revised: 11/02/2018 Document Reviewed: 08/14/2016 Elsevier Patient Education  2020 Elsevier Inc.   

## 2019-07-25 ENCOUNTER — Other Ambulatory Visit: Payer: Self-pay

## 2019-07-25 ENCOUNTER — Ambulatory Visit (INDEPENDENT_AMBULATORY_CARE_PROVIDER_SITE_OTHER): Payer: Medicare Other

## 2019-07-25 DIAGNOSIS — R0600 Dyspnea, unspecified: Secondary | ICD-10-CM

## 2019-07-25 NOTE — Progress Notes (Signed)
Complete echocardiogram has been performed.  Jimmy Valaria Kohut RDCS, RVT 

## 2019-08-02 ENCOUNTER — Other Ambulatory Visit: Payer: Self-pay

## 2019-08-02 LAB — CUP PACEART INCLINIC DEVICE CHECK
Date Time Interrogation Session: 20201109133233
Implantable Lead Implant Date: 20191029
Implantable Lead Implant Date: 20191029
Implantable Lead Location: 753859
Implantable Lead Location: 753860
Implantable Lead Model: 3830
Implantable Lead Model: 5076
Implantable Pulse Generator Implant Date: 20191029

## 2019-09-10 ENCOUNTER — Other Ambulatory Visit: Payer: Self-pay | Admitting: Diagnostic Neuroimaging

## 2019-09-11 ENCOUNTER — Ambulatory Visit (INDEPENDENT_AMBULATORY_CARE_PROVIDER_SITE_OTHER): Payer: Medicare Other | Admitting: *Deleted

## 2019-09-11 DIAGNOSIS — I480 Paroxysmal atrial fibrillation: Secondary | ICD-10-CM

## 2019-09-11 LAB — CUP PACEART REMOTE DEVICE CHECK
Battery Remaining Longevity: 146 mo
Battery Voltage: 3.01 V
Brady Statistic AP VP Percent: 0.77 %
Brady Statistic AP VS Percent: 0.17 %
Brady Statistic AS VP Percent: 75.38 %
Brady Statistic AS VS Percent: 23.67 %
Brady Statistic RA Percent Paced: 0.99 %
Brady Statistic RV Percent Paced: 76.15 %
Date Time Interrogation Session: 20210215231756
Implantable Lead Implant Date: 20191029
Implantable Lead Implant Date: 20191029
Implantable Lead Location: 753859
Implantable Lead Location: 753860
Implantable Lead Model: 3830
Implantable Lead Model: 5076
Implantable Pulse Generator Implant Date: 20191029
Lead Channel Impedance Value: 304 Ohm
Lead Channel Impedance Value: 380 Ohm
Lead Channel Impedance Value: 456 Ohm
Lead Channel Impedance Value: 532 Ohm
Lead Channel Pacing Threshold Amplitude: 0.75 V
Lead Channel Pacing Threshold Amplitude: 1 V
Lead Channel Pacing Threshold Pulse Width: 0.4 ms
Lead Channel Pacing Threshold Pulse Width: 0.4 ms
Lead Channel Sensing Intrinsic Amplitude: 11.375 mV
Lead Channel Sensing Intrinsic Amplitude: 11.375 mV
Lead Channel Sensing Intrinsic Amplitude: 2.125 mV
Lead Channel Sensing Intrinsic Amplitude: 2.125 mV
Lead Channel Setting Pacing Amplitude: 2 V
Lead Channel Setting Pacing Amplitude: 2.5 V
Lead Channel Setting Pacing Pulse Width: 0.4 ms
Lead Channel Setting Sensing Sensitivity: 1.2 mV

## 2019-09-12 NOTE — Progress Notes (Signed)
PPM Remote  

## 2019-09-18 ENCOUNTER — Other Ambulatory Visit: Payer: Self-pay | Admitting: Cardiology

## 2019-12-10 ENCOUNTER — Telehealth: Payer: Self-pay | Admitting: Cardiology

## 2019-12-10 LAB — CUP PACEART REMOTE DEVICE CHECK
Battery Remaining Longevity: 139 mo
Battery Voltage: 3 V
Brady Statistic AP VP Percent: 1.12 %
Brady Statistic AP VS Percent: 0 %
Brady Statistic AS VP Percent: 98.82 %
Brady Statistic AS VS Percent: 0.06 %
Brady Statistic RA Percent Paced: 1.11 %
Brady Statistic RV Percent Paced: 99.94 %
Date Time Interrogation Session: 20210515113140
Implantable Lead Implant Date: 20191029
Implantable Lead Implant Date: 20191029
Implantable Lead Location: 753859
Implantable Lead Location: 753860
Implantable Lead Model: 3830
Implantable Lead Model: 5076
Implantable Pulse Generator Implant Date: 20191029
Lead Channel Impedance Value: 304 Ohm
Lead Channel Impedance Value: 361 Ohm
Lead Channel Impedance Value: 456 Ohm
Lead Channel Impedance Value: 551 Ohm
Lead Channel Pacing Threshold Amplitude: 0.75 V
Lead Channel Pacing Threshold Amplitude: 1.125 V
Lead Channel Pacing Threshold Pulse Width: 0.4 ms
Lead Channel Pacing Threshold Pulse Width: 0.4 ms
Lead Channel Sensing Intrinsic Amplitude: 11.375 mV
Lead Channel Sensing Intrinsic Amplitude: 11.375 mV
Lead Channel Sensing Intrinsic Amplitude: 2 mV
Lead Channel Sensing Intrinsic Amplitude: 2 mV
Lead Channel Setting Pacing Amplitude: 2.25 V
Lead Channel Setting Pacing Amplitude: 2.5 V
Lead Channel Setting Pacing Pulse Width: 0.4 ms
Lead Channel Setting Sensing Sensitivity: 1.2 mV

## 2019-12-10 NOTE — Telephone Encounter (Signed)
New message   Patient's daughter states that [facility]'s phone # is [phone]. She states that this is in reference to ordering a wheel chair.

## 2019-12-11 ENCOUNTER — Ambulatory Visit (INDEPENDENT_AMBULATORY_CARE_PROVIDER_SITE_OTHER): Payer: Medicare Other | Admitting: *Deleted

## 2019-12-11 DIAGNOSIS — I480 Paroxysmal atrial fibrillation: Secondary | ICD-10-CM

## 2019-12-11 DIAGNOSIS — I442 Atrioventricular block, complete: Secondary | ICD-10-CM

## 2019-12-11 NOTE — Telephone Encounter (Signed)
Aware that further documentation is needed/requested from [facility]. Aware I will have Dr. Elberta Fortis review when he returns next week. Nita agreeable to plan

## 2019-12-11 NOTE — Telephone Encounter (Signed)
Follow Up  Raynelle Fanning from [employer] [facility] is calling in to speak with Sherri (Dr. Elberta Fortis). Please give Raynelle Fanning a call back at [phone] extension [phone].

## 2019-12-11 NOTE — Telephone Encounter (Signed)
lmtcb

## 2019-12-11 NOTE — Telephone Encounter (Signed)
Called [employer] [facility], fax number given [phone]. Order faxed, confirmation received.

## 2019-12-12 NOTE — Progress Notes (Signed)
Remote pacemaker transmission.   

## 2019-12-18 ENCOUNTER — Telehealth: Payer: Self-pay | Admitting: Cardiology

## 2019-12-18 NOTE — Telephone Encounter (Signed)
New Message  Talked with daughter of patient and she would like to speak with a nurse. Would not go into further details.

## 2019-12-18 NOTE — Telephone Encounter (Signed)
Spoke to dtr. She was asking about dad appt w/ Bing Matter, she wanted it to be virtual.  States dad does not ambulate much at all now, only to go to the bathroom. We also discussed need for face-to-face visit to qualify for wheelchair (Dr. Elberta Fortis was going to aid in this if needed).  Aware that if Dr. Kirtland Bouchard agreeable to seeing pt in-office in July, then he might be willing to document needed information for pt to qualify for wheelchair (I have requirements from [facility] that can forward to Dr Kirtland Bouchard) and if so pt will need to come into the office for that visit.   Dtr agreeable to coming into office if we can document needed information for wheelchair.   Aware I will review w/ Dr. Ledora Bottcher if he is agreeable -- if so then we will keep July appt in-office.  Aware I will let her know once advised by Dr. Bing Matter. dtr agreeable to plan.

## 2019-12-20 NOTE — Telephone Encounter (Signed)
Fine with me

## 2019-12-25 ENCOUNTER — Telehealth: Payer: Self-pay | Admitting: Cardiology

## 2019-12-25 NOTE — Telephone Encounter (Signed)
Marylene Land from [employer] [facility] states that she will have to decline the referral for the wheelchair until the patient has his next face to face appointment. She states that they will need documentation from that appt that states why the patient needs a wheelchair. This will need to be discussed on 01/25/20 with Dr. Bing Matter.

## 2019-12-28 ENCOUNTER — Telehealth: Payer: Self-pay | Admitting: Cardiology

## 2019-12-28 NOTE — Telephone Encounter (Signed)
Called spoke to patient daughter informed her per Dr. Bing Matter we can switch appointment to virtual. This was completed. No further questions.

## 2019-12-28 NOTE — Telephone Encounter (Signed)
Patient's daughter, Janett Billow, is requesting to make appointment scheduled for 074/22/21 at 2:00 PM with Dr. Bing Matter virtual. Please assist.

## 2020-01-08 ENCOUNTER — Other Ambulatory Visit: Payer: Self-pay | Admitting: Emergency Medicine

## 2020-01-08 MED ORDER — FUROSEMIDE 20 MG PO TABS: ORAL_TABLET | ORAL | 1 refills | Status: AC

## 2020-01-17 ENCOUNTER — Telehealth: Payer: Self-pay | Admitting: Cardiology

## 2020-01-17 NOTE — Telephone Encounter (Signed)
New Message  [facility] is calling and is requesting a copy of the pts last Echo to be faxed to them  Fax # [phone]  Please advise

## 2020-01-18 NOTE — Telephone Encounter (Signed)
ECHO sent electronically to [facility]. 

## 2020-01-25 ENCOUNTER — Ambulatory Visit: Payer: Medicare Other | Admitting: Cardiology

## 2020-02-14 ENCOUNTER — Telehealth (INDEPENDENT_AMBULATORY_CARE_PROVIDER_SITE_OTHER): Payer: Medicare Other | Admitting: Cardiology

## 2020-02-14 ENCOUNTER — Encounter: Payer: Self-pay | Admitting: Cardiology

## 2020-02-14 VITALS — BP 109/74 | HR 96 | Ht 70.0 in | Wt 130.0 lb

## 2020-02-14 DIAGNOSIS — I420 Dilated cardiomyopathy: Secondary | ICD-10-CM | POA: Diagnosis not present

## 2020-02-14 DIAGNOSIS — I447 Left bundle-branch block, unspecified: Secondary | ICD-10-CM

## 2020-02-14 DIAGNOSIS — I480 Paroxysmal atrial fibrillation: Secondary | ICD-10-CM

## 2020-02-14 DIAGNOSIS — J84112 Idiopathic pulmonary fibrosis: Secondary | ICD-10-CM

## 2020-02-14 DIAGNOSIS — I442 Atrioventricular block, complete: Secondary | ICD-10-CM | POA: Diagnosis not present

## 2020-02-14 DIAGNOSIS — Z950 Presence of cardiac pacemaker: Secondary | ICD-10-CM | POA: Diagnosis not present

## 2020-02-14 NOTE — Progress Notes (Signed)
Virtual Visit via Video Note   This visit type was conducted due to national recommendations for restrictions regarding the COVID-19 Pandemic (e.g. social distancing) in an effort to limit this patient's exposure and mitigate transmission in our community.  Due to his co-morbid illnesses, this patient is at least at moderate risk for complications without adequate follow up.  This format is felt to be most appropriate for this patient at this time.  All issues noted in this document were discussed and addressed.  A limited physical exam was performed with this format.  Please refer to the patient's chart for his consent to telehealth for [facility]  Video Connection Lost Video connection was lost at < 50% of the duration of this visit, at which time the remainder of the visit was completed via audio only.   Evaluation Performed:  Follow-up visit  This visit type was conducted due to national recommendations for restrictions regarding the COVID-19 Pandemic (e.g. social distancing).  This format is felt to be most appropriate for this patient at this time.  All issues noted in this document were discussed and addressed.  No physical exam was performed (except for noted visual exam findings with Video Visits).  Please refer to the patient's chart (MyChart message for video visits and phone note for telephone visits) for the patient's consent to telehealth for [facility].  Date:  02/14/2020  ID: Dale Winters, DOB Jun 05, 1941, MRN [id number]   Patient Location: [address] [place] [address]   Provider location:   [facility] [place] Office  PCP:  Lise Auer, MD  Cardiologist:  Gypsy Balsam, MD     Chief Complaint: I am short of breath  History of Present Illness:    Dale Winters is a 79 y.o. male  who presents via audio/video conferencing for a telehealth visit today.  With past medical history significant for left bundle branch block, cardiomyopathy with mildly  diminished ejection fraction, idiopathic pulmonary fibrosis, paroxysmal atrial fibrillation, left bundle branch block.  He does have video visit with me today.  His sick is sitting on the chair and effort will bring shortness of breath.  He is not able to do anything.  He said he barely can walk to the restroom.  This is the reason for why he requested video visit.  Denies having any chest pain tightness squeezing pressure burning chest there is no swelling of lower extremities.  He sleeps fine.  He does not have to get up in the middle of the night to urinate, he does not need to get up in the middle of the night to him to catch his breath.   The patient does not have symptoms concerning for COVID-19 infection (fever, chills, cough, or new SHORTNESS OF BREATH).    Prior CV studies:   The following studies were reviewed today:  Echocardiogram reviewed from 07/25/2019 showing ejection fraction 50 to 55%, severe left ventricle hypertrophy.  Pulmonary hypertension with pulmonary pressure 44 mmHg     Past Medical History:  Diagnosis Date  . Abnormal ECG   . Appendicitis    1956  . CHF (congestive heart failure) (HCC)   . Complete heart block (HCC) 04/2018   a. noted 04/2018 with RBBB morphology with prior h/o LBBB - s/p [employer] PPM 05/23/18, complicated by atrial lead microperforation felt due to seizure, with pericardial effusion requiring pericardiocentesis  . Dilated cardiomyopathy (HCC)    a. prev EF 40-50%. b. EF 55-60% by  echo 04/2018.  Marland Kitchen HTN (hypertension)   . IPF (idiopathic pulmonary fibrosis) (HCC)   . LBBB (left bundle branch block)   . PAF (paroxysmal atrial fibrillation) (HCC)    a. noted during 10-05/2018 adm, not placed on anticoag at that time due to brief nature and recent pericardial effusion.  . Pericardial effusion   . S/P pericardiocentesis   . Seizures (HCC)    a. dx in hospital 04/2018.    Past Surgical History:  Procedure Laterality Date  . APPENDECTOMY      . PACEMAKER IMPLANT N/A 05/23/2018   Procedure: PACEMAKER IMPLANT;  Surgeon: Marinus Maw, MD;  Location: [facility] INVASIVE CV LAB;  Service: Cardiovascular;  Laterality: N/A;  . PERICARDIOCENTESIS N/A 05/24/2018   Procedure: PERICARDIOCENTESIS;  Surgeon: Iran Ouch, MD;  Location: [facility] INVASIVE CV LAB;  Service: Cardiovascular;  Laterality: N/A;     Current Meds  Medication Sig  . albuterol (PROVENTIL HFA;VENTOLIN HFA) 108 (90 Base) MCG/ACT inhaler Inhale 1 puff into the lungs every 6 (six) hours as needed for wheezing or shortness of breath.  Marland Kitchen aspirin EC 81 MG tablet Take 81 mg by mouth at bedtime.  . budesonide-formoterol (SYMBICORT) 160-4.5 MCG/ACT inhaler Inhale 2 puffs into the lungs 2 (two) times daily.  Marland Kitchen levETIRAcetam (KEPPRA) 500 MG tablet Take 1 tablet (500 mg total) by mouth 2 (two) times daily.  Marland Kitchen loratadine (CLARITIN) 10 MG tablet Take 10 mg by mouth daily.  Marland Kitchen LORazepam (ATIVAN) 0.5 MG tablet Take 1 tablet by mouth as needed.  . mirtazapine (REMERON) 15 MG tablet Take 15 mg by mouth at bedtime.   . mometasone-formoterol (DULERA) 200-5 MCG/ACT AERO Inhale 2 puffs into the lungs 2 (two) times daily.  . Multiple Vitamin (MULTIVITAMIN WITH MINERALS) TABS tablet Take 1 tablet by mouth daily.  Marland Kitchen omeprazole (PRILOSEC OTC) 20 MG tablet Take 20 mg by mouth daily as needed (stomach acid).  . OXYGEN Inhale 2-4 L into the lungs continuous.  . potassium chloride (KLOR-CON) 10 MEQ tablet TAKE 1 TABLET(10 MEQ) BY MOUTH DAILY  . Probiotic Product (PROBIOTIC PO) Take 1 tablet by mouth daily.  . vitamin B-12 (CYANOCOBALAMIN) 500 MCG tablet Take 500 mcg by mouth daily.       Family History: The patient's family history includes Kidney failure in his sister; Stroke in his mother.   ROS:   Please see the history of present illness.     All other systems reviewed and are negative.   Labs/Other Tests and Data Reviewed:     Recent Labs: 04/11/2019: NT-Pro BNP 164 04/18/2019: BUN 10;  Creatinine, Ser 0.75; Potassium 4.3; Sodium 144  Recent Lipid Panel    Component Value Date/Time   CHOL 142 05/23/2018 0032   TRIG 116 05/23/2018 0032   HDL 44 05/23/2018 0032   CHOLHDL 3.2 05/23/2018 0032   VLDL 23 05/23/2018 0032   LDLCALC 75 05/23/2018 0032      Exam:    Vital Signs:  BP 109/74   Pulse 96   Ht 5\' 10"  (1.778 m)   Wt 130 lb (59 kg)   BMI 18.65 kg/m     Wt Readings from Last 3 Encounters:  02/14/20 130 lb (59 kg)  07/09/19 140 lb (63.5 kg)  06/04/19 145 lb (65.8 kg)     Well nourished, well developed in no acute distress. Alert awake and x3 talking to me from his home and I am in our office in [place].  He is not in any distress  but even during the conversation he gets short of breath.  He seems to be quite cheerful.  His daughter is assisting with this visit  Diagnosis for this visit:   1. Transient complete heart block (HCC)   2. Pacemaker   3. IPF (idiopathic pulmonary fibrosis) (HCC)   4. Dilated cardiomyopathy (HCC)   5. PAF (paroxysmal atrial fibrillation) (HCC)   6. Left bundle branch block      ASSESSMENT & PLAN:    1.  Complete heart block.  That being addressed with pacemaker, pacemaker interrogation reviewed normal function will continue monitoring. Idiopathic pulmonary fibrosis: It is definitely the leading problem.  He did see pulmonary specialist years ago he was offered to take some medication however he refused he does not want to do anything about it and he understand that he is very sick because of that. 3.  History of dilated cardiomyopathy, echocardiogram reviewed from December showed ejection fraction 5055%.  He is on appropriate medication which I will continue. 4.  Left bundle branch block, it is a chronic problem will continue. 5.  Paroxysmal atrial fibrillation not anticoagulated because of patient preference improved status overall. 6.  Dyspnea on exertion it is most likely related to his pulmonary fibrosis.  COVID-19  Education: The signs and symptoms of COVID-19 were discussed with the patient and how to seek care for testing (follow up with PCP or arrange E-visit).  The importance of social distancing was discussed today.  Patient Risk:   After full review of this patients clinical status, I feel that they are at least moderate risk at this time.  Time:   Today, I have spent 15 minutes with the patient with telehealth technology discussing pt health issues.  I spent 20 minutes reviewing her chart before the visit.  Visit was finished at 219.    Medication Adjustments/Labs and Tests Ordered: Current medicines are reviewed at length with the patient today.  Concerns regarding medicines are outlined above.  No orders of the defined types were placed in this encounter.  Medication changes: No orders of the defined types were placed in this encounter.    Disposition: Follow-up 6 months  Signed, Georgeanna Lea, MD, [facility] 02/14/2020 2:13 PM    [facility]

## 2020-03-10 DIAGNOSIS — I447 Left bundle-branch block, unspecified: Secondary | ICD-10-CM | POA: Diagnosis not present

## 2020-03-10 DIAGNOSIS — I110 Hypertensive heart disease with heart failure: Secondary | ICD-10-CM | POA: Diagnosis not present

## 2020-03-10 DIAGNOSIS — G40909 Epilepsy, unspecified, not intractable, without status epilepticus: Secondary | ICD-10-CM | POA: Diagnosis not present

## 2020-03-10 DIAGNOSIS — K625 Hemorrhage of anus and rectum: Secondary | ICD-10-CM | POA: Diagnosis not present

## 2020-03-10 DIAGNOSIS — Z87891 Personal history of nicotine dependence: Secondary | ICD-10-CM | POA: Diagnosis not present

## 2020-03-10 DIAGNOSIS — J84112 Idiopathic pulmonary fibrosis: Secondary | ICD-10-CM | POA: Diagnosis not present

## 2020-03-10 DIAGNOSIS — J309 Allergic rhinitis, unspecified: Secondary | ICD-10-CM | POA: Diagnosis not present

## 2020-03-10 DIAGNOSIS — Z9981 Dependence on supplemental oxygen: Secondary | ICD-10-CM | POA: Diagnosis not present

## 2020-03-10 DIAGNOSIS — I420 Dilated cardiomyopathy: Secondary | ICD-10-CM | POA: Diagnosis not present

## 2020-03-10 DIAGNOSIS — Z95810 Presence of automatic (implantable) cardiac defibrillator: Secondary | ICD-10-CM | POA: Diagnosis not present

## 2020-03-10 DIAGNOSIS — I5032 Chronic diastolic (congestive) heart failure: Secondary | ICD-10-CM | POA: Diagnosis not present

## 2020-03-10 DIAGNOSIS — I480 Paroxysmal atrial fibrillation: Secondary | ICD-10-CM | POA: Diagnosis not present

## 2020-03-11 ENCOUNTER — Ambulatory Visit (INDEPENDENT_AMBULATORY_CARE_PROVIDER_SITE_OTHER): Payer: Medicare Other | Admitting: *Deleted

## 2020-03-11 DIAGNOSIS — I442 Atrioventricular block, complete: Secondary | ICD-10-CM | POA: Diagnosis not present

## 2020-03-11 DIAGNOSIS — Z9981 Dependence on supplemental oxygen: Secondary | ICD-10-CM | POA: Diagnosis not present

## 2020-03-11 DIAGNOSIS — I5032 Chronic diastolic (congestive) heart failure: Secondary | ICD-10-CM | POA: Diagnosis not present

## 2020-03-11 DIAGNOSIS — I447 Left bundle-branch block, unspecified: Secondary | ICD-10-CM | POA: Diagnosis not present

## 2020-03-11 DIAGNOSIS — J84112 Idiopathic pulmonary fibrosis: Secondary | ICD-10-CM | POA: Diagnosis not present

## 2020-03-11 DIAGNOSIS — I110 Hypertensive heart disease with heart failure: Secondary | ICD-10-CM | POA: Diagnosis not present

## 2020-03-11 DIAGNOSIS — Z95810 Presence of automatic (implantable) cardiac defibrillator: Secondary | ICD-10-CM | POA: Diagnosis not present

## 2020-03-11 LAB — CUP PACEART REMOTE DEVICE CHECK
Battery Remaining Longevity: 131 mo
Battery Voltage: 2.99 V
Brady Statistic AP VP Percent: 0.27 %
Brady Statistic AP VS Percent: 0 %
Brady Statistic AS VP Percent: 99.66 %
Brady Statistic AS VS Percent: 0.07 %
Brady Statistic RA Percent Paced: 0.26 %
Brady Statistic RV Percent Paced: 99.93 %
Date Time Interrogation Session: 20210817045332
Implantable Lead Implant Date: 20191029
Implantable Lead Implant Date: 20191029
Implantable Lead Location: 753859
Implantable Lead Location: 753860
Implantable Lead Model: 3830
Implantable Lead Model: 5076
Implantable Pulse Generator Implant Date: 20191029
Lead Channel Impedance Value: 304 Ohm
Lead Channel Impedance Value: 342 Ohm
Lead Channel Impedance Value: 456 Ohm
Lead Channel Impedance Value: 532 Ohm
Lead Channel Pacing Threshold Amplitude: 0.625 V
Lead Channel Pacing Threshold Amplitude: 1 V
Lead Channel Pacing Threshold Pulse Width: 0.4 ms
Lead Channel Pacing Threshold Pulse Width: 0.4 ms
Lead Channel Sensing Intrinsic Amplitude: 11.375 mV
Lead Channel Sensing Intrinsic Amplitude: 11.375 mV
Lead Channel Sensing Intrinsic Amplitude: 2.875 mV
Lead Channel Sensing Intrinsic Amplitude: 2.875 mV
Lead Channel Setting Pacing Amplitude: 2 V
Lead Channel Setting Pacing Amplitude: 2.5 V
Lead Channel Setting Pacing Pulse Width: 0.4 ms
Lead Channel Setting Sensing Sensitivity: 1.2 mV

## 2020-03-12 DIAGNOSIS — I110 Hypertensive heart disease with heart failure: Secondary | ICD-10-CM | POA: Diagnosis not present

## 2020-03-12 DIAGNOSIS — I5032 Chronic diastolic (congestive) heart failure: Secondary | ICD-10-CM | POA: Diagnosis not present

## 2020-03-12 DIAGNOSIS — Z95810 Presence of automatic (implantable) cardiac defibrillator: Secondary | ICD-10-CM | POA: Diagnosis not present

## 2020-03-12 DIAGNOSIS — J84112 Idiopathic pulmonary fibrosis: Secondary | ICD-10-CM | POA: Diagnosis not present

## 2020-03-12 DIAGNOSIS — Z9981 Dependence on supplemental oxygen: Secondary | ICD-10-CM | POA: Diagnosis not present

## 2020-03-12 DIAGNOSIS — I447 Left bundle-branch block, unspecified: Secondary | ICD-10-CM | POA: Diagnosis not present

## 2020-03-13 NOTE — Progress Notes (Signed)
Remote pacemaker transmission.   

## 2020-03-14 DIAGNOSIS — J84112 Idiopathic pulmonary fibrosis: Secondary | ICD-10-CM | POA: Diagnosis not present

## 2020-03-14 DIAGNOSIS — I447 Left bundle-branch block, unspecified: Secondary | ICD-10-CM | POA: Diagnosis not present

## 2020-03-14 DIAGNOSIS — I110 Hypertensive heart disease with heart failure: Secondary | ICD-10-CM | POA: Diagnosis not present

## 2020-03-14 DIAGNOSIS — Z9981 Dependence on supplemental oxygen: Secondary | ICD-10-CM | POA: Diagnosis not present

## 2020-03-14 DIAGNOSIS — I5032 Chronic diastolic (congestive) heart failure: Secondary | ICD-10-CM | POA: Diagnosis not present

## 2020-03-14 DIAGNOSIS — Z95810 Presence of automatic (implantable) cardiac defibrillator: Secondary | ICD-10-CM | POA: Diagnosis not present

## 2020-03-26 DEATH — deceased

## 2020-06-24 IMAGING — CT CT HEAD W/O CM
3 series · 15 of 47 positions shown, 18 images · non-contrast
Comparison: None.

CLINICAL DATA: Seizures.

EXAM:
CT HEAD WITHOUT CONTRAST
TECHNIQUE: Contiguous axial images were obtained from the base of the skull
through the vertex without intravenous contrast.

[Series 3: head 5.0 h30s · axial · 0.41mm/px · z∈[-108,+22]mm · 9 of 32 slices shown, 12 images]
[im 3/32  brain]
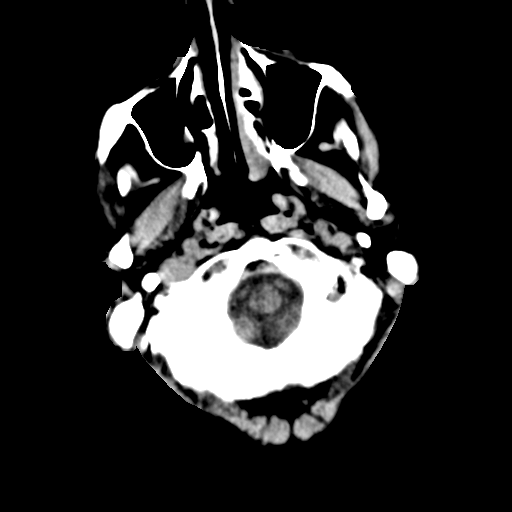
[im 3/32  bone]
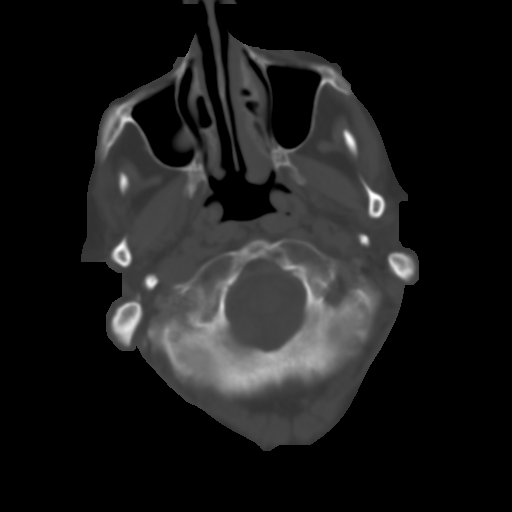
[im 6/32  brain]
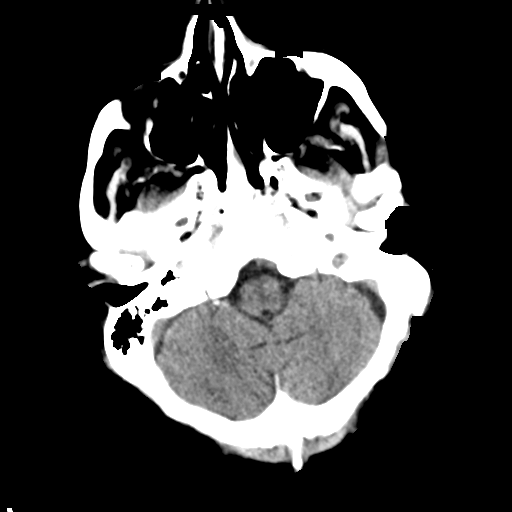
[im 9/32  brain]
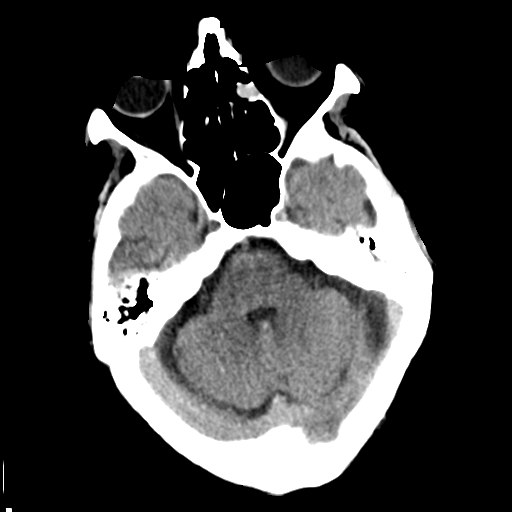
[im 12/32  brain]
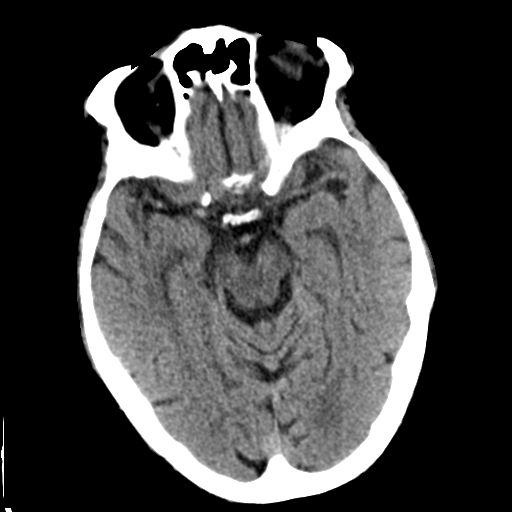
[im 17/32  brain]
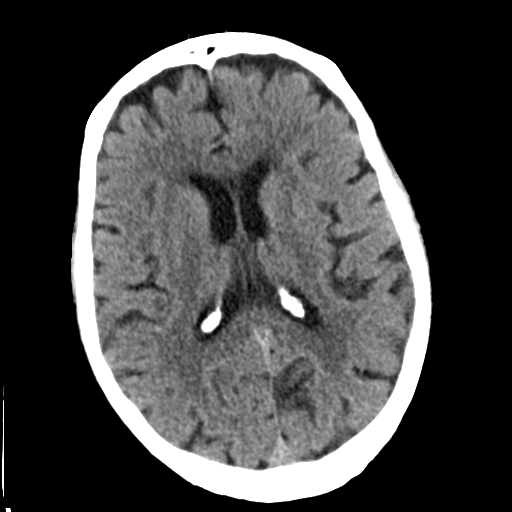
[im 17/32  bone]
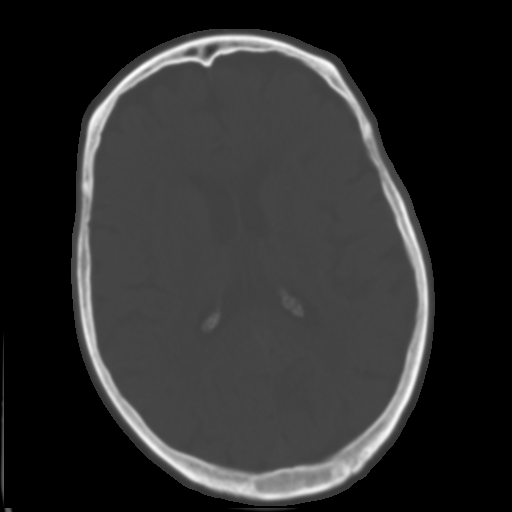
[im 20/32  brain]
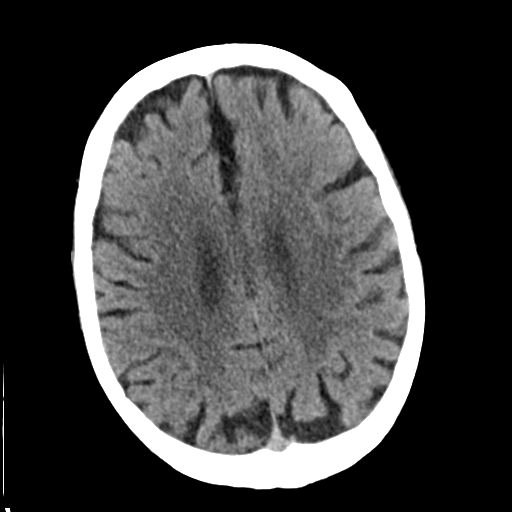
[im 23/32  brain]
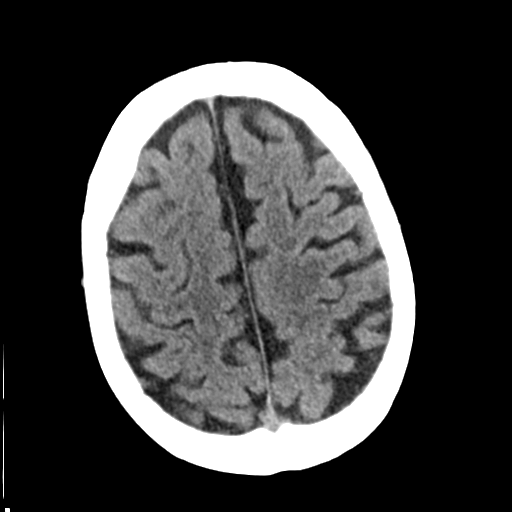
[im 26/32  brain]
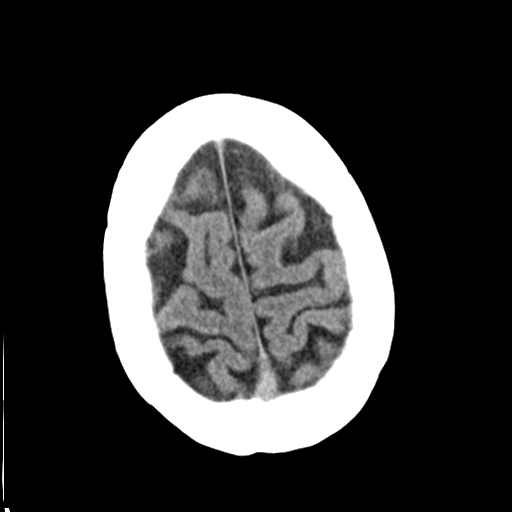
[im 29/32  brain]
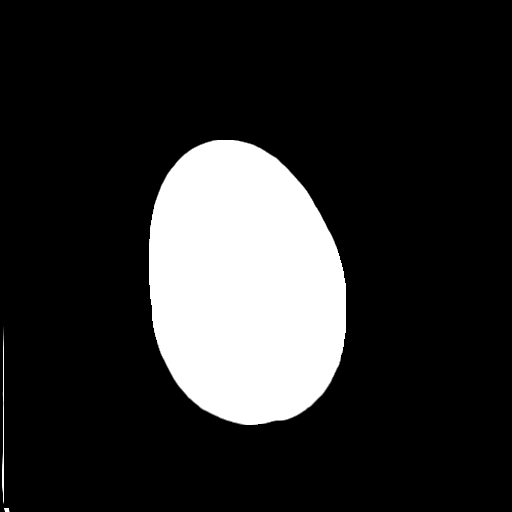
[im 29/32  bone]
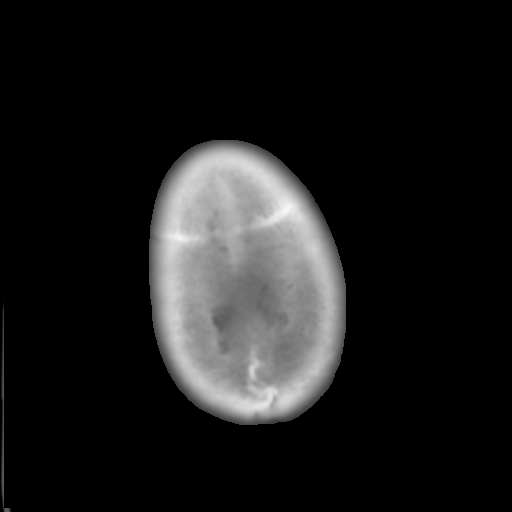

[Series 5: head 3.0 mpr cor · coronal · 0.31mm/px · 3 of 70 slices shown]
[im 24/70  brain]
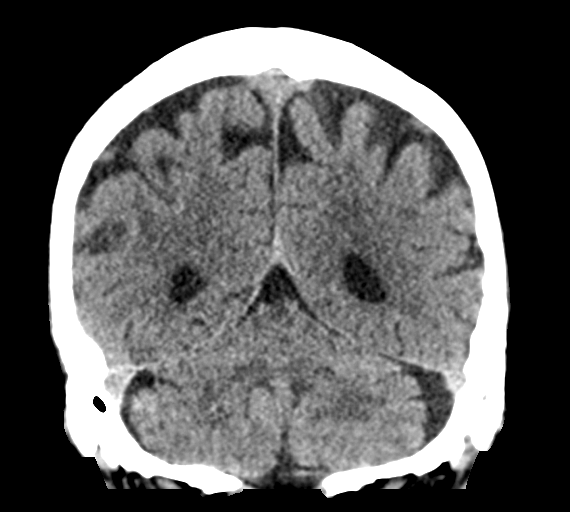
[im 31/70  brain]
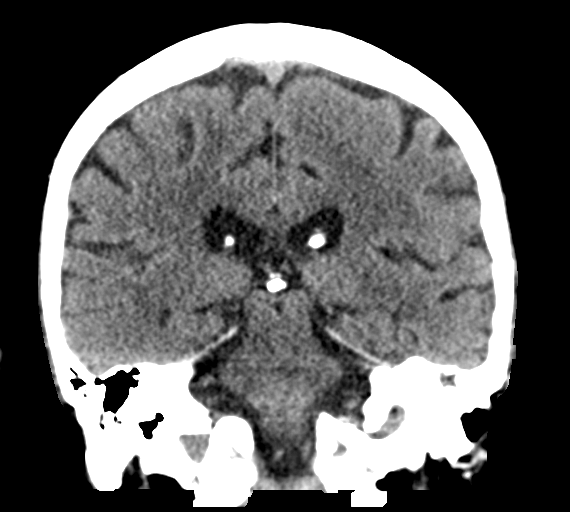
[im 39/70  brain]
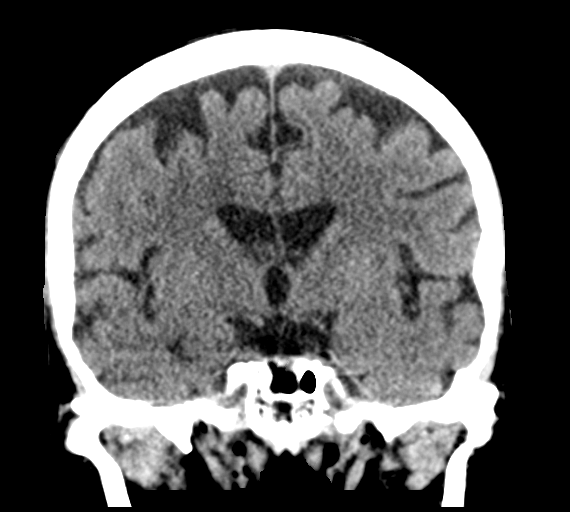

[Series 6: head 3.0 mpr sag · sagittal · 0.31mm/px · 3 of 57 slices shown]
[im 19/57  brain]
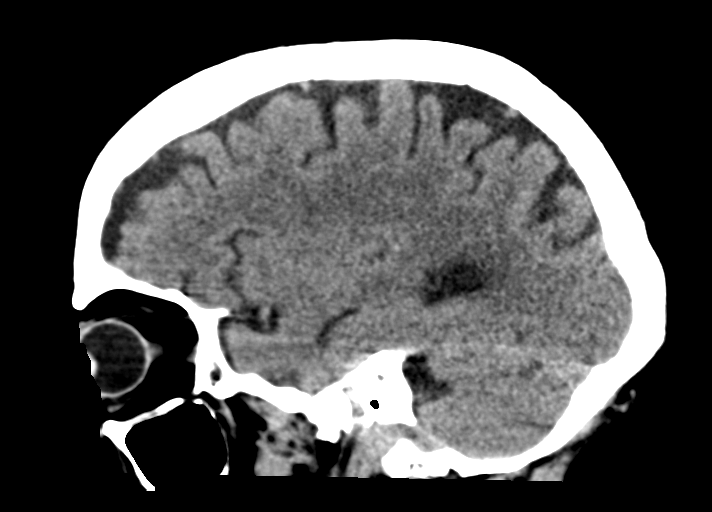
[im 29/57  brain]
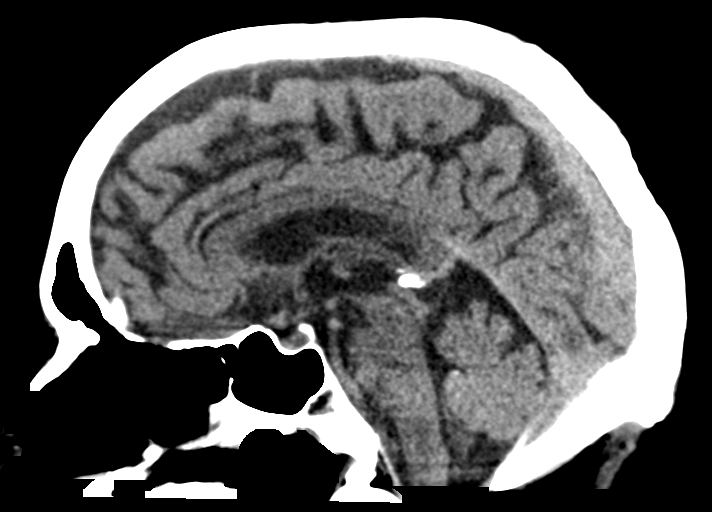
[im 38/57  brain]
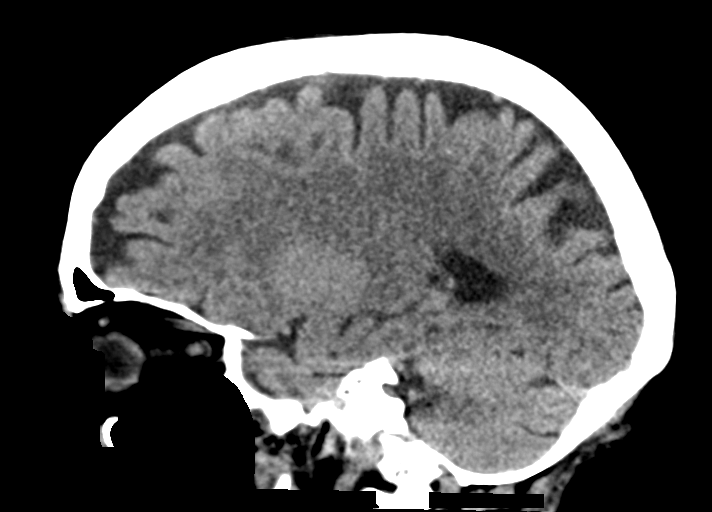

[15 of 47 positions shown; findings below may reference images not displayed]

FINDINGS: Brain: Mild diffuse cortical atrophy is noted. Mild chronic ischemic
white matter disease is noted. No mass effect or midline shift is
noted. Ventricular size is within normal limits. There is no
evidence of mass lesion, hemorrhage or acute infarction.

Vascular: No hyperdense vessel or unexpected calcification.

Skull: Normal. Negative for fracture or focal lesion.

Sinuses/Orbits: Small right maxillary mucous retention cyst is
noted.

Other: Fluid is noted in the left mastoid air cells.
IMPRESSION: Mild diffuse cortical atrophy. Mild chronic ischemic white matter
disease. No acute intracranial abnormality seen.

## 2020-06-25 IMAGING — DX DG CHEST 1V PORT
1 series · 1 of 1 positions shown · non-contrast
Comparison: Portable exam 2705 hours compared 05/25/2018

CLINICAL DATA: Pericardial effusion

EXAM:
PORTABLE CHEST 1 VIEW

[chest ap]
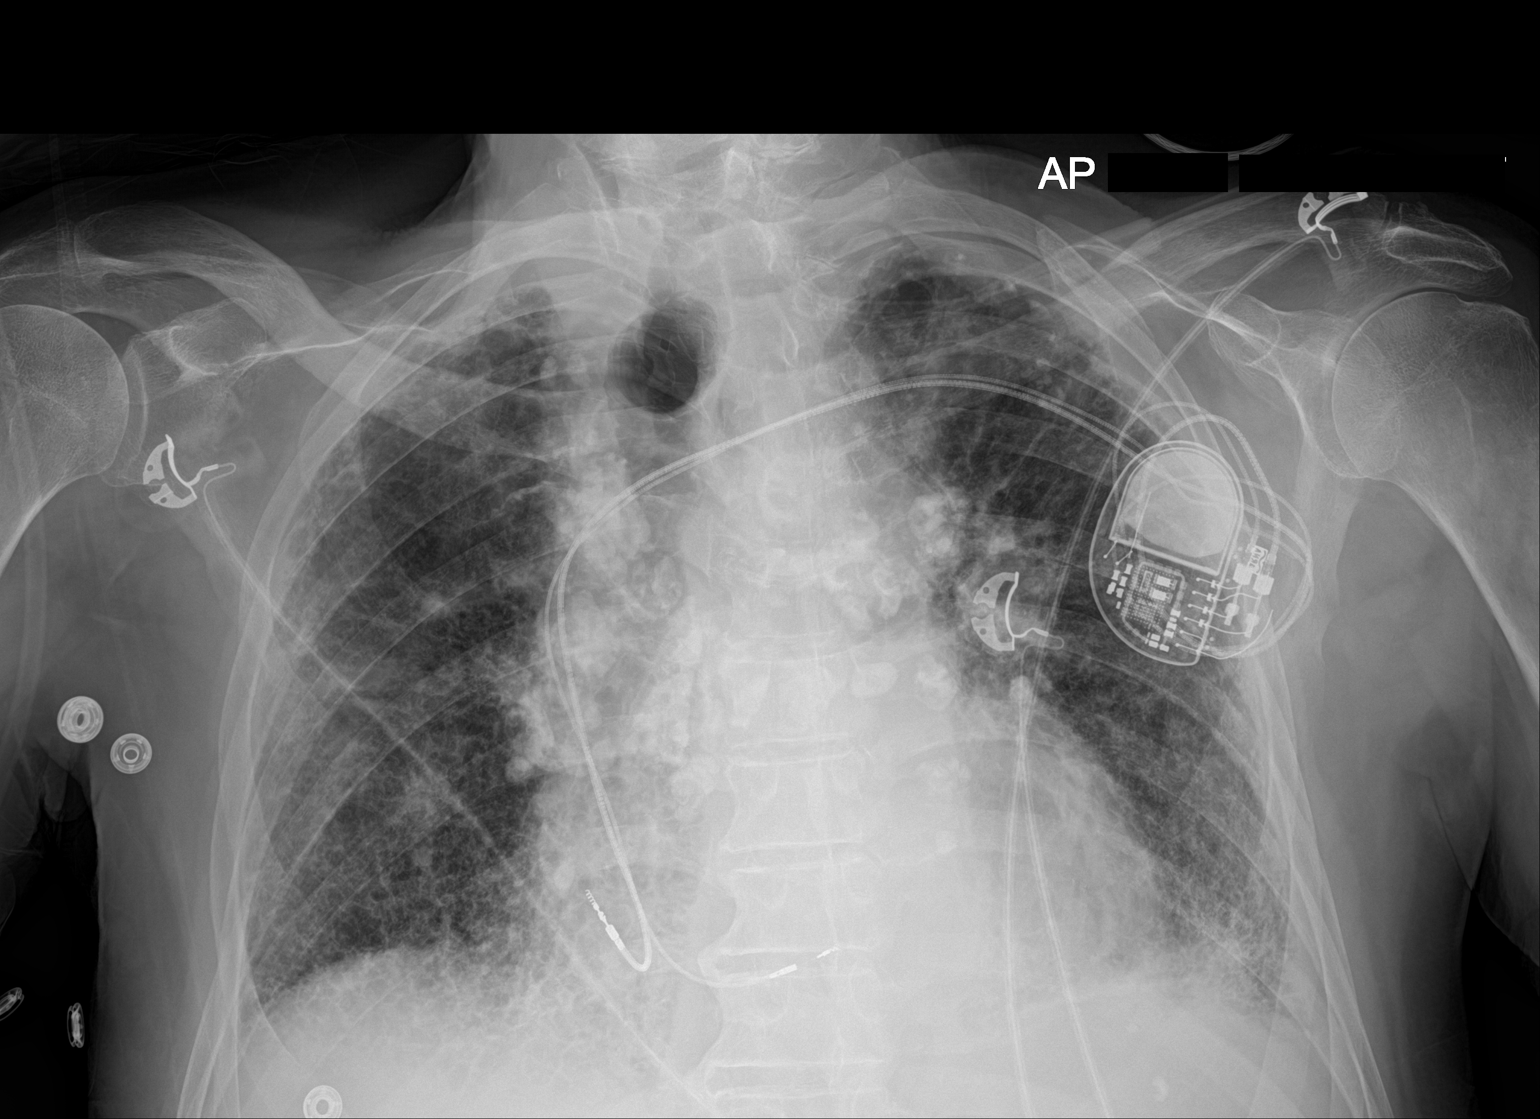

[1 of 1 positions shown; findings below may reference images not displayed]

FINDINGS: LEFT subclavian pacemaker leads project over RIGHT atrium and RIGHT
ventricle.

Interval removal of infra diaphragmatic Swan-Ganz catheter.

Enlargement of cardiac silhouette with slight pulmonary vascular
congestion.

Extensive calcified mediastinal and hilar adenopathy.

Chronic interstitial lung disease/fibrosis changes greatest at the
mid to lower lungs.

Calcified pulmonary granulomata.

No definite acute infiltrate, pleural effusion or pneumothorax.

Bones demineralized.
IMPRESSION: Enlargement of cardiac silhouette.

Extensive old granulomatous disease changes.

Pulmonary fibrosis.

No definite acute abnormalities.
